# Patient Record
Sex: Male | Born: 1995 | Race: Black or African American | Hispanic: No | Marital: Single | Smoking: Current every day smoker
Health system: Southern US, Community
[De-identification: ages and names within clinical notes are randomized; demographics above are authoritative.]

## PROBLEM LIST (undated history)

## (undated) DIAGNOSIS — K5090 Crohn's disease, unspecified, without complications: Secondary | ICD-10-CM

## (undated) DIAGNOSIS — K769 Liver disease, unspecified: Secondary | ICD-10-CM

## (undated) DIAGNOSIS — K7460 Unspecified cirrhosis of liver: Secondary | ICD-10-CM

## (undated) HISTORY — PX: COLONOSCOPY W/ BIOPSIES: SHX1374

## (undated) HISTORY — PX: ESOPHAGOSCOPY: SUR460

---
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
## 2016-04-19 ENCOUNTER — Emergency Department (HOSPITAL_COMMUNITY)
Admission: EM | Admit: 2016-04-19 | Discharge: 2016-04-20 | Disposition: A | Discharge status: Home / Self Care | Payer: Medicaid Other | Attending: Emergency Medicine | Admitting: Emergency Medicine

## 2016-04-19 ENCOUNTER — Encounter (HOSPITAL_COMMUNITY): Payer: Self-pay | Admitting: Oncology

## 2016-04-19 DIAGNOSIS — R1084 Generalized abdominal pain: Secondary | ICD-10-CM | POA: Insufficient documentation

## 2016-04-19 DIAGNOSIS — R1012 Left upper quadrant pain: Secondary | ICD-10-CM | POA: Diagnosis present

## 2016-04-19 DIAGNOSIS — F17210 Nicotine dependence, cigarettes, uncomplicated: Secondary | ICD-10-CM | POA: Insufficient documentation

## 2016-04-19 DIAGNOSIS — Z79899 Other long term (current) drug therapy: Secondary | ICD-10-CM | POA: Insufficient documentation

## 2016-04-19 LAB — COMPREHENSIVE METABOLIC PANEL
ALK PHOS: 120 U/L (ref 38–126)
ALT: 132 U/L — ABNORMAL HIGH (ref 17–63)
ANION GAP: 6 (ref 5–15)
AST: 84 U/L — ABNORMAL HIGH (ref 15–41)
Albumin: 4.5 g/dL (ref 3.5–5.0)
BUN: 13 mg/dL (ref 6–20)
CALCIUM: 9.3 mg/dL (ref 8.9–10.3)
CHLORIDE: 105 mmol/L (ref 101–111)
CO2: 26 mmol/L (ref 22–32)
Creatinine, Ser: 0.61 mg/dL (ref 0.61–1.24)
GFR calc non Af Amer: >60 mL/min (ref >60)
Glucose, Bld: 96 mg/dL (ref 65–99)
POTASSIUM: 3.8 mmol/L (ref 3.5–5.1)
SODIUM: 137 mmol/L (ref 135–145)
Total Bilirubin: 0.9 mg/dL (ref 0.3–1.2)
Total Protein: 7.7 g/dL (ref 6.5–8.1)

## 2016-04-19 LAB — CBC
HCT: 45.5 % (ref 39.0–52.0)
HEMOGLOBIN: 16.7 g/dL (ref 13.0–17.0)
MCH: 30.5 pg (ref 26.0–34.0)
MCHC: 36.7 g/dL — ABNORMAL HIGH (ref 30.0–36.0)
MCV: 83.2 fL (ref 78.0–100.0)
Platelets: 182 10*3/uL (ref 150–400)
RBC: 5.47 MIL/uL (ref 4.22–5.81)
RDW: 12 % (ref 11.5–15.5)
WBC: 9.7 10*3/uL (ref 4.0–10.5)

## 2016-04-19 LAB — LIPASE, BLOOD: LIPASE: 23 U/L (ref 11–51)

## 2016-04-19 MED ORDER — ONDANSETRON HCL 4 MG/2ML IJ SOLN
4.0000 mg | Freq: Once | INTRAMUSCULAR | Status: AC
  Administered 2016-04-19: 4 mg via INTRAVENOUS
  Filled 2016-04-19: qty 2

## 2016-04-19 MED ORDER — HYDROMORPHONE HCL 1 MG/ML IJ SOLN
1.0000 mg | Freq: Once | INTRAMUSCULAR | Status: AC
  Administered 2016-04-19: 1 mg via INTRAVENOUS
  Filled 2016-04-19: qty 1

## 2016-04-19 MED ORDER — IOPAMIDOL (ISOVUE-300) INJECTION 61%
INTRAVENOUS | Status: DC
Start: 2016-04-19 — End: 2016-04-20
  Filled 2016-04-19: qty 30

## 2016-04-19 MED ORDER — IOPAMIDOL (ISOVUE-300) INJECTION 61%
30.0000 mL | Freq: Once | INTRAVENOUS | Status: AC | PRN
  Administered 2016-04-19: 30 mL via ORAL

## 2016-04-19 MED ORDER — SODIUM CHLORIDE 0.9 % IV BOLUS (SEPSIS)
1000.0000 mL | Freq: Once | INTRAVENOUS | Status: AC
  Administered 2016-04-19: 1000 mL via INTRAVENOUS

## 2016-04-19 NOTE — ED Notes (Signed)
Bed: WA18 Expected date:  Expected time:  Means of arrival:  Comments: Ems  

## 2016-04-19 NOTE — ED Provider Notes (Signed)
[facility]-EMERGENCY DEPT Provider Note   CSN: [id number] Arrival date & time: 04/19/16  2117  By signing my name below, I, Anthony Ponce, attest that this documentation has been prepared under the direction and in the presence of non-physician practitioner, [name], PA-C. Electronically Signed: Modena Ponce, Scribe. 04/19/2016. 9:31 PM.  History   Chief Complaint Chief Complaint  Patient presents with  . Abdominal Pain   The history is provided by the patient. No language interpreter was used.   HPI Comments: Anthony Ponce is a 20 y.o. male who presents to the Emergency Department complaining of constant moderate LUQ abdominal pain that started today. He was diagnosed with Crohn's disease a few years ago and is currently not on medication. He suspects his pain today is due to his Crohn's, however does note girlfriend recently has had a 'stomach bug'.  He took pepto-bismol PTA without relief. He describes the pain as a non-radiating, sharp sensation. He reports associated nausea and gagging. He admits to a hx of stomach ulcers. Seen previously at [facility]. He denies any hx of alcohol use, recent NSAID use, vomiting, diarrhea, blood in stool, or other complaints.     PCP: [name], MD  Past Medical History:  Diagnosis Date  . Cirrhosis (HCC)   . Crohn disease (HCC)   . Liver damage    Pt goes [facility]    There are no active problems to display for this patient.   Past Surgical History:  Procedure Laterality Date  . COLONOSCOPY W/ BIOPSIES    . ESOPHAGOSCOPY         Home Medications    Prior to Admission medications   Medication Sig Start Date End Date Taking? Authorizing Provider  azelastine (ASTELIN) 0.1 % nasal spray Place 2 sprays into both nostrils 2 (two) times daily. Use in each nostril as directed Patient not taking: Reported on 04/19/2016 05/17/15   April Palumbo, MD  ondansetron (ZOFRAN) 4 MG tablet Take 1 tablet (4 mg total) by mouth every 6  (six) hours. Patient not taking: Reported on 04/19/2016 07/23/15   [name], PA-C  predniSONE (DELTASONE) 20 MG tablet Take 3 tablets (60 mg total) by mouth daily. Patient not taking: Reported on 04/19/2016 07/23/15   [name], PA-C  traMADol (ULTRAM) 50 MG tablet Take 1 tablet (50 mg total) by mouth every 6 (six) hours as needed. Patient not taking: Reported on 04/19/2016 07/23/15   [name], PA-C    Family History No family history on file.  Social History Social History  Substance Use Topics  . Smoking status: Current Every Day Smoker    Packs/day: 0.50    Types: Cigarettes  . Smokeless tobacco: Never Used  . Alcohol use Yes     Comment: liquor on 09/25/15 unknown amount     Allergies   Patient has no known allergies.   Review of Systems Review of Systems  Constitutional: Negative for fever.  HENT: Negative for rhinorrhea and sore throat.   Eyes: Negative for redness.  Respiratory: Negative for cough.   Cardiovascular: Negative for chest pain.  Gastrointestinal: Positive for abdominal pain and nausea. Negative for blood in stool, diarrhea and vomiting.  Genitourinary: Negative for dysuria.  Musculoskeletal: Negative for myalgias.  Skin: Negative for rash.  Neurological: Negative for headaches.     Physical Exam Updated Vital Signs BP 131/82 (BP Location: Left Arm)   Pulse 100   Temp 99.1 F (37.3 C) (Oral)   Resp 20   Ht  5\' 10"  (1.778 m)   Wt 168 lb (76.2 kg)   SpO2 98%   BMI 24.11 kg/m   Physical Exam  Constitutional: He appears well-developed and well-nourished. No distress.  HENT:  Head: Normocephalic and atraumatic.  Eyes: Conjunctivae are normal. Right eye exhibits no discharge. Left eye exhibits no discharge.  Neck: Normal range of motion. Neck supple.  Cardiovascular: Normal rate, regular rhythm and normal heart sounds.   Pulmonary/Chest: Effort normal and breath sounds normal.  Abdominal: Soft. He exhibits no distension and no  mass. There is tenderness. There is no rebound and no guarding.  LUQ and epigastric abd pain.   Musculoskeletal: Normal range of motion.  Neurological: He is alert.  Skin: Skin is warm and dry.  Psychiatric: He has a normal mood and affect.  Nursing note and vitals reviewed.    ED Treatments / Results  DIAGNOSTIC STUDIES: Oxygen Saturation is 98% on RA, normal by my interpretation.    COORDINATION OF CARE: 9:57 PM- Pt advised of plan for treatment and pt agrees.  Labs (all labs ordered are listed, but only abnormal results are displayed) Labs Reviewed  COMPREHENSIVE METABOLIC PANEL - Abnormal; Notable for the following:       Result Value   AST 84 (*)    ALT 132 (*)    All other components within normal limits  CBC - Abnormal; Notable for the following:    MCHC 36.7 (*)    All other components within normal limits  URINALYSIS, ROUTINE W REFLEX MICROSCOPIC - Abnormal; Notable for the following:    APPearance HAZY (*)    All other components within normal limits  LIPASE, BLOOD    Radiology Ct Abdomen Pelvis W Contrast  Result Date: 04/20/2016 CLINICAL DATA:  Left upper quadrant abdominal pain for 1 day. Nausea. EXAM: CT ABDOMEN AND PELVIS WITH CONTRAST TECHNIQUE: Multidetector CT imaging of the abdomen and pelvis was performed using the standard protocol following bolus administration of intravenous contrast. CONTRAST:  100 mL Isovue-300 intravenous COMPARISON:  09/14/2014 MRI, 11/20/2012 CT FINDINGS: Lower chest: No acute abnormality. Hepatobiliary: No focal liver abnormality is seen. No gallstones, gallbladder wall thickening, or biliary dilatation. Pancreas: Unremarkable. No pancreatic ductal dilatation or surrounding inflammatory changes. Spleen: Normal in size without focal abnormality. Adrenals/Urinary Tract: Adrenal glands are unremarkable. Kidneys are normal, without renal calculi, focal lesion, or hydronephrosis. Bladder is unremarkable. Stomach/Bowel: Stomach is within  normal limits. Colon appears normal. No evidence of bowel wall thickening, distention, or inflammatory changes. Vascular/Lymphatic: No significant vascular findings are present. No enlarged abdominal or pelvic lymph nodes. Reproductive: Unremarkable Other: No acute inflammatory changes.  No ascites. Musculoskeletal: No significant skeletal lesion. IMPRESSION: No significant abnormality.  Mild motion degradation of the images. Electronically Signed   By: Ellery Plunk M.D.   On: 04/20/2016 01:16    Procedures Procedures (including critical care time)  Medications Ordered in ED Medications  iopamidol (ISOVUE-300) 61 % injection (not administered)  HYDROmorphone (DILAUDID) injection 1 mg (1 mg Intravenous Given 04/19/16 2232)  ondansetron (ZOFRAN) injection 4 mg (4 mg Intravenous Given 04/19/16 2232)  sodium chloride 0.9 % bolus 1,000 mL (0 mLs Intravenous Stopped 04/19/16 2329)  iopamidol (ISOVUE-300) 61 % injection 30 mL (30 mLs Oral Contrast Given 04/19/16 2256)  iopamidol (ISOVUE-300) 61 % injection 100 mL (100 mLs Intravenous Contrast Given 04/20/16 0052)  sodium chloride 0.9 % bolus 1,000 mL (0 mLs Intravenous Stopped 04/20/16 0243)  pantoprazole (PROTONIX) injection 40 mg (40 mg Intravenous Given 04/20/16 0121)  ondansetron [facility](ZOFRAN) injection 4 mg (4 mg Intravenous Given 04/20/16 0121)  ondansetron (ZOFRAN) injection 4 mg (4 mg Intravenous Given 04/20/16 0249)  HYDROmorphone (DILAUDID) injection 1 mg (1 mg Intravenous Given 04/20/16 0548)  sodium chloride 0.9 % bolus 1,000 mL (0 mLs Intravenous Stopped 04/20/16 0704)     Initial Impression / Assessment and Plan / ED Course  I have reviewed the triage vital signs and the nursing notes.  Pertinent labs & imaging results that were available during my care of the patient were reviewed by me and considered in my medical decision making (see chart for details).  Clinical Course    Patient seen and examined. Work-up initiated. Medications ordered.     Vital signs reviewed and are as follows: BP 117/75   Pulse 92   Temp 99.1 F (37.3 C) (Oral)   Resp 18   Ht 5\' 10"  (1.778 m)   Wt 76.2 kg   SpO2 96%   BMI 24.11 kg/m   Patient stable overnight. CT negative. Symptoms controlled with pain medication, fluids, antiemetics. Patient tolerating fluids and crackers in the room. Stable for discharge to home.  The patient was urged to return to the Emergency Department immediately with worsening of current symptoms, worsening abdominal pain, persistent vomiting, blood noted in stools, fever, or any other concerns. The patient verbalized understanding.   Patient counseled on use of narcotic pain medications. Counseled not to combine these medications with others containing tylenol. Urged not to drink alcohol, drive, or perform any other activities that requires focus while taking these medications. The patient verbalizes understanding and agrees with the plan.   Final Clinical Impressions(s) / ED Diagnoses   Final diagnoses:  Generalized abdominal pain   Patient with generalized abdominal pain, nausea, history of Crohn's disease. Pain was severe at arrival. CT ordered and is negative for acute problem. Symptom control overnight with IV fluids, pain medications, antiemetics. Patient feeling better and tolerating orals at time of discharge. Return instructions as above. Encouraged PCP follow-up.  Vitals are stable, no fever. Labs reassuring. Imaging neg. No signs of dehydration, patient is tolerating PO's. Lungs are clear and no signs suggestive of PNA. Low concern for appendicitis, cholecystitis, pancreatitis, ruptured viscus, UTI, kidney stone, aortic dissection, aortic aneurysm or other emergent abdominal etiology. Supportive therapy indicated with return if symptoms worsen.    New Prescriptions Discharge Medication List as of 04/20/2016  6:39 AM    START taking these medications   Details  HYDROcodone-acetaminophen (NORCO/VICODIN) 5-325 MG  tablet Take 1-2 tablets by mouth every 4 (four) hours as needed., Starting Mon 04/20/2016, Print    ondansetron (ZOFRAN ODT) 4 MG disintegrating tablet Take 1 tablet (4 mg total) by mouth every 8 (eight) hours as needed for nausea or vomiting., Starting Mon 04/20/2016, Print       I personally performed the services described in this documentation, which was scribed in my presence. The recorded information has been reviewed and is accurate.     Renne CriglerJoshua Noya Santarelli, PA-C 04/20/16 [id number]    Charlynne Panderavid Hsienta Yao, MD 04/20/16 1154

## 2016-04-19 NOTE — ED Triage Notes (Signed)
Pt bib [facility] from home d/t abdominal pain, nausea and vomiting today.  Pt given 4 mg IV zofran en route.  Pt has hx of crohn's and is non compliant w/ medication.  C/o  LUQ/LLQ pain.

## 2016-04-20 ENCOUNTER — Emergency Department (HOSPITAL_COMMUNITY): Payer: Medicaid Other

## 2016-04-20 ENCOUNTER — Encounter (HOSPITAL_COMMUNITY): Payer: Self-pay

## 2016-04-20 LAB — URINALYSIS, ROUTINE W REFLEX MICROSCOPIC
Bilirubin Urine: NEGATIVE
Glucose, UA: NEGATIVE mg/dL
HGB URINE DIPSTICK: NEGATIVE
Ketones, ur: NEGATIVE mg/dL
Leukocytes, UA: NEGATIVE
NITRITE: NEGATIVE
PROTEIN: NEGATIVE mg/dL
Specific Gravity, Urine: 1.016 (ref 1.005–1.030)
pH: 7 (ref 5.0–8.0)

## 2016-04-20 MED ORDER — IOPAMIDOL (ISOVUE-300) INJECTION 61%
INTRAVENOUS | Status: AC
  Administered 2016-04-20: 100 mL via INTRAVENOUS
  Filled 2016-04-20: qty 100

## 2016-04-20 MED ORDER — ONDANSETRON HCL 4 MG/2ML IJ SOLN
4.0000 mg | Freq: Once | INTRAMUSCULAR | Status: AC
  Administered 2016-04-20: 4 mg via INTRAVENOUS
  Filled 2016-04-20: qty 2

## 2016-04-20 MED ORDER — IOPAMIDOL (ISOVUE-300) INJECTION 61%
100.0000 mL | Freq: Once | INTRAVENOUS | Status: AC | PRN
  Administered 2016-04-20: 100 mL via INTRAVENOUS

## 2016-04-20 MED ORDER — ONDANSETRON 4 MG PO TBDP: 4.0000 mg | ORAL_TABLET | Freq: Three times a day (TID) | ORAL | 0 refills | Status: DC | PRN

## 2016-04-20 MED ORDER — HYDROCODONE-ACETAMINOPHEN 5-325 MG PO TABS: 1.0000 | ORAL_TABLET | ORAL | 0 refills | Status: DC | PRN

## 2016-04-20 MED ORDER — PANTOPRAZOLE SODIUM 40 MG IV SOLR
40.0000 mg | Freq: Once | INTRAVENOUS | Status: AC
  Administered 2016-04-20: 40 mg via INTRAVENOUS
  Filled 2016-04-20: qty 40

## 2016-04-20 MED ORDER — HYDROMORPHONE HCL 1 MG/ML IJ SOLN
1.0000 mg | Freq: Once | INTRAMUSCULAR | Status: AC
  Administered 2016-04-20: 1 mg via INTRAVENOUS
  Filled 2016-04-20: qty 1

## 2016-04-20 MED ORDER — SODIUM CHLORIDE 0.9 % IV BOLUS (SEPSIS)
1000.0000 mL | Freq: Once | INTRAVENOUS | Status: AC
  Administered 2016-04-20: 1000 mL via INTRAVENOUS

## 2016-04-20 NOTE — ED Notes (Signed)
Patient transported to CT 

## 2016-04-20 NOTE — Discharge Instructions (Signed)
Please read and follow all provided instructions.  Your diagnoses today include:  1. Generalized abdominal pain     Tests performed today include:  Blood counts and electrolytes  Blood tests to check liver and kidney function  Blood tests to check pancreas function  Urine test to look for infection  CT scan of your abdomen - does not show any serious problems  Vital signs. See below for your results today.   Medications prescribed:   Vicodin (hydrocodone/acetaminophen) - narcotic pain medication  DO NOT drive or perform any activities that require you to be awake and alert because this medicine can make you drowsy. BE VERY CAREFUL not to take multiple medicines containing Tylenol (also called acetaminophen). Doing so can lead to an overdose which can damage your liver and cause liver failure and possibly death.   Zofran (ondansetron) - for nausea and vomiting  Take any prescribed medications only as directed.  Home care instructions:   Follow any educational materials contained in this packet.  Follow-up instructions: Please follow-up with your primary care provider in the next 3 days for further evaluation of your symptoms.    Return instructions:  SEEK IMMEDIATE MEDICAL ATTENTION IF:  The pain does not go away or becomes severe   A temperature above 101F develops   Repeated vomiting occurs (multiple episodes)   The pain becomes localized to portions of the abdomen. The right side could possibly be appendicitis. In an adult, the left lower portion of the abdomen could be colitis or diverticulitis.   Blood is being passed in stools or vomit (bright red or black tarry stools)   You develop chest pain, difficulty breathing, dizziness or fainting, or become confused, poorly responsive, or inconsolable (young children)  If you have any other emergent concerns regarding your health  Additional Information: Abdominal (belly) pain can be caused by many things. Your  caregiver performed an examination and possibly ordered blood/urine tests and imaging (CT scan, x-rays, ultrasound). Many cases can be observed and treated at home after initial evaluation in the emergency department. Even though you are being discharged home, abdominal pain can be unpredictable. Therefore, you need a repeated exam if your pain does not resolve, returns, or worsens. Most patients with abdominal pain don't have to be admitted to the hospital or have surgery, but serious problems like appendicitis and gallbladder attacks can start out as nonspecific pain. Many abdominal conditions cannot be diagnosed in one visit, so follow-up evaluations are very important.  Your vital signs today were: BP 118/71    Pulse 86    Temp 99.1 F (37.3 C) (Oral)    Resp 18    Ht 5\' 10"  (1.778 m)    Wt 76.2 kg    SpO2 94%    BMI 24.11 kg/m  If your blood pressure (bp) was elevated above 135/85 this visit, please have this repeated by your doctor within one month. --------------

## 2016-10-28 ENCOUNTER — Encounter (HOSPITAL_COMMUNITY): Payer: Self-pay | Admitting: *Deleted

## 2016-10-28 ENCOUNTER — Ambulatory Visit (HOSPITAL_COMMUNITY)
Admission: EM | Admit: 2016-10-28 | Discharge: 2016-10-28 | Disposition: A | Discharge status: Home / Self Care | Payer: Medicaid Other | Attending: Internal Medicine | Admitting: Internal Medicine

## 2016-10-28 DIAGNOSIS — S39012A Strain of muscle, fascia and tendon of lower back, initial encounter: Secondary | ICD-10-CM | POA: Diagnosis not present

## 2016-10-28 DIAGNOSIS — M62830 Muscle spasm of back: Secondary | ICD-10-CM

## 2016-10-28 MED ORDER — MELOXICAM 15 MG PO TABS: 15.0000 mg | ORAL_TABLET | Freq: Every day | ORAL | 0 refills | Status: DC

## 2016-10-28 MED ORDER — IBUPROFEN 800 MG PO TABS
800.0000 mg | ORAL_TABLET | Freq: Once | ORAL | Status: AC
  Administered 2016-10-28: 800 mg via ORAL

## 2016-10-28 MED ORDER — IBUPROFEN 800 MG PO TABS
ORAL_TABLET | ORAL | Status: AC
  Filled 2016-10-28: qty 1

## 2016-10-28 MED ORDER — METHOCARBAMOL 500 MG PO TABS: 500.0000 mg | ORAL_TABLET | Freq: Two times a day (BID) | ORAL | 0 refills | Status: DC

## 2016-10-28 NOTE — ED Triage Notes (Addendum)
Pt  Reports  Back  Pain    And         Had   Some   Muscle    Spasm           Had   Some   Numbness          Better   Today    Works       In  The   [employer]  [employer]ot     Outside    Recently        Never   [employer]

## 2016-10-28 NOTE — Discharge Instructions (Signed)
°  Meloxicam (Mobic) is an antiinflammatory to help with pain and inflammation.  Do not take ibuprofen, Advil, Aleve, or any other medications that contain NSAIDs while taking meloxicam as this may cause stomach upset or even ulcers if taken in large amounts for an extended period of time.  ° °Robaxin (methocarbamol) is a muscle relaxer and may cause drowsiness. Do not drink alcohol, drive, or operate heavy machinery while taking. ° °

## 2016-10-28 NOTE — ED Provider Notes (Signed)
CSN: [id number]     Arrival date & time 10/28/16  1707 History   First MD Initiated Contact with Patient 10/28/16 1741     Chief Complaint  Patient presents with  . Back Pain   (Consider location/radiation/quality/duration/timing/severity/associated sxs/prior Treatment) HPI  Anthony Ponce is a 21 y.o. male presenting to UC with c/o Left side mid back pain that started about 2 weeks ago. Pt does heavy lifting at work and thinks it started when lifting some cinder blocks.  Pain is sharp and burning, worse with certain movements.  Pain is 5/10. No radiation of pain or numbness. No hx of back surgeries. He has tried tylenol w/o relief.     Past Medical History:  Diagnosis Date  . Cirrhosis (HCC)   . Crohn disease (HCC)   . Liver damage    Pt goes to [facility]   Past Surgical History:  Procedure Laterality Date  . COLONOSCOPY W/ BIOPSIES    . ESOPHAGOSCOPY     No family history on file. Social History  Substance Use Topics  . Smoking status: Current Every Day Smoker    Packs/day: 0.50    Types: Cigarettes  . Smokeless tobacco: Never Used  . Alcohol use Yes     Comment: liquor on 09/25/15 unknown amount    Review of Systems  Constitutional: Negative for chills and fever.  Cardiovascular: Negative for chest pain and palpitations.  Gastrointestinal: Negative for abdominal pain, diarrhea, nausea and vomiting.  Genitourinary: Negative for dysuria, frequency and hematuria.  Musculoskeletal: Positive for back pain and myalgias. Negative for arthralgias.  Skin: Negative for rash.  Neurological: Negative for weakness and numbness.    Allergies  Patient has no known allergies.  Home Medications   Prior to Admission medications   Medication Sig Start Date End Date Taking? Authorizing Provider  HYDROcodone-acetaminophen (NORCO/VICODIN) 5-325 MG tablet Take 1-2 tablets by mouth every 4 (four) hours as needed. 04/20/16   Renne Crigler, PA-C  meloxicam (MOBIC) 15 MG tablet  Take 1 tablet (15 mg total) by mouth daily. For 5 days, then daily as needed for pain. 10/28/16   Lurene Shadow, PA-C  methocarbamol (ROBAXIN) 500 MG tablet Take 1 tablet (500 mg total) by mouth 2 (two) times daily. 10/28/16   Lurene Shadow, PA-C  ondansetron (ZOFRAN ODT) 4 MG disintegrating tablet Take 1 tablet (4 mg total) by mouth every 8 (eight) hours as needed for nausea or vomiting. 04/20/16   Renne Crigler, PA-C   Meds Ordered and Administered this Visit   Medications  ibuprofen (ADVIL,MOTRIN) tablet 800 mg (800 mg Oral Given 10/28/16 1801)    BP 117/70 (BP Location: Right Arm)   Pulse 78   Temp 98.6 F (37 C) (Oral)   Resp 18   SpO2 100%  No data found.   Physical Exam  Constitutional: He is oriented to person, place, and time. He appears well-developed and well-nourished. No distress.  HENT:  Head: Normocephalic and atraumatic.  Eyes: EOM are normal.  Neck: Normal range of motion.  Cardiovascular: Normal rate.   Pulmonary/Chest: Effort normal.  Musculoskeletal: Normal range of motion. He exhibits tenderness. He exhibits no edema.  No midline spinal tenderness. Tenderness to Left mid thoracic paraspinal muscles.  Full ROM upper and lower extremities bilaterally with 5/5 strength.  Neurological: He is alert and oriented to person, place, and time.  Skin: Skin is warm and dry. No rash noted. He is not diaphoretic. No erythema.  Back: skin in tact. No  ecchymosis or erythema.  Psychiatric: He has a normal mood and affect. His behavior is normal.  Nursing note and vitals reviewed.   Urgent Care Course     Procedures (including critical care time)  Labs Review Labs Reviewed - No data to display  Imaging Review No results found.    MDM   1. Back strain, initial encounter   2. Muscle spasm of back    Hx and exam c/w mid back strain. No indication for imaging at this time.   Will treat conservatively at this time. Rx: Robaxin and Meloxicam  Home care  instructions provided with back exercises F/u with PCP, Sports Medicine or Orthopedics in 1 week if not improving Work note provided to be off today, tomorrow, followed by 3 days of light duty.     Lurene Shadow, [place] 10/28/16 [phone]

## 2017-12-31 IMAGING — CR DG CHEST 2V
2 series · 2 of 2 positions shown · non-contrast
Comparison: None.

CLINICAL DATA: Cough and chest congestion for 2 weeks, worse this
morning. Smoker.

EXAM:
CHEST  2 VIEW

[w chest pa]
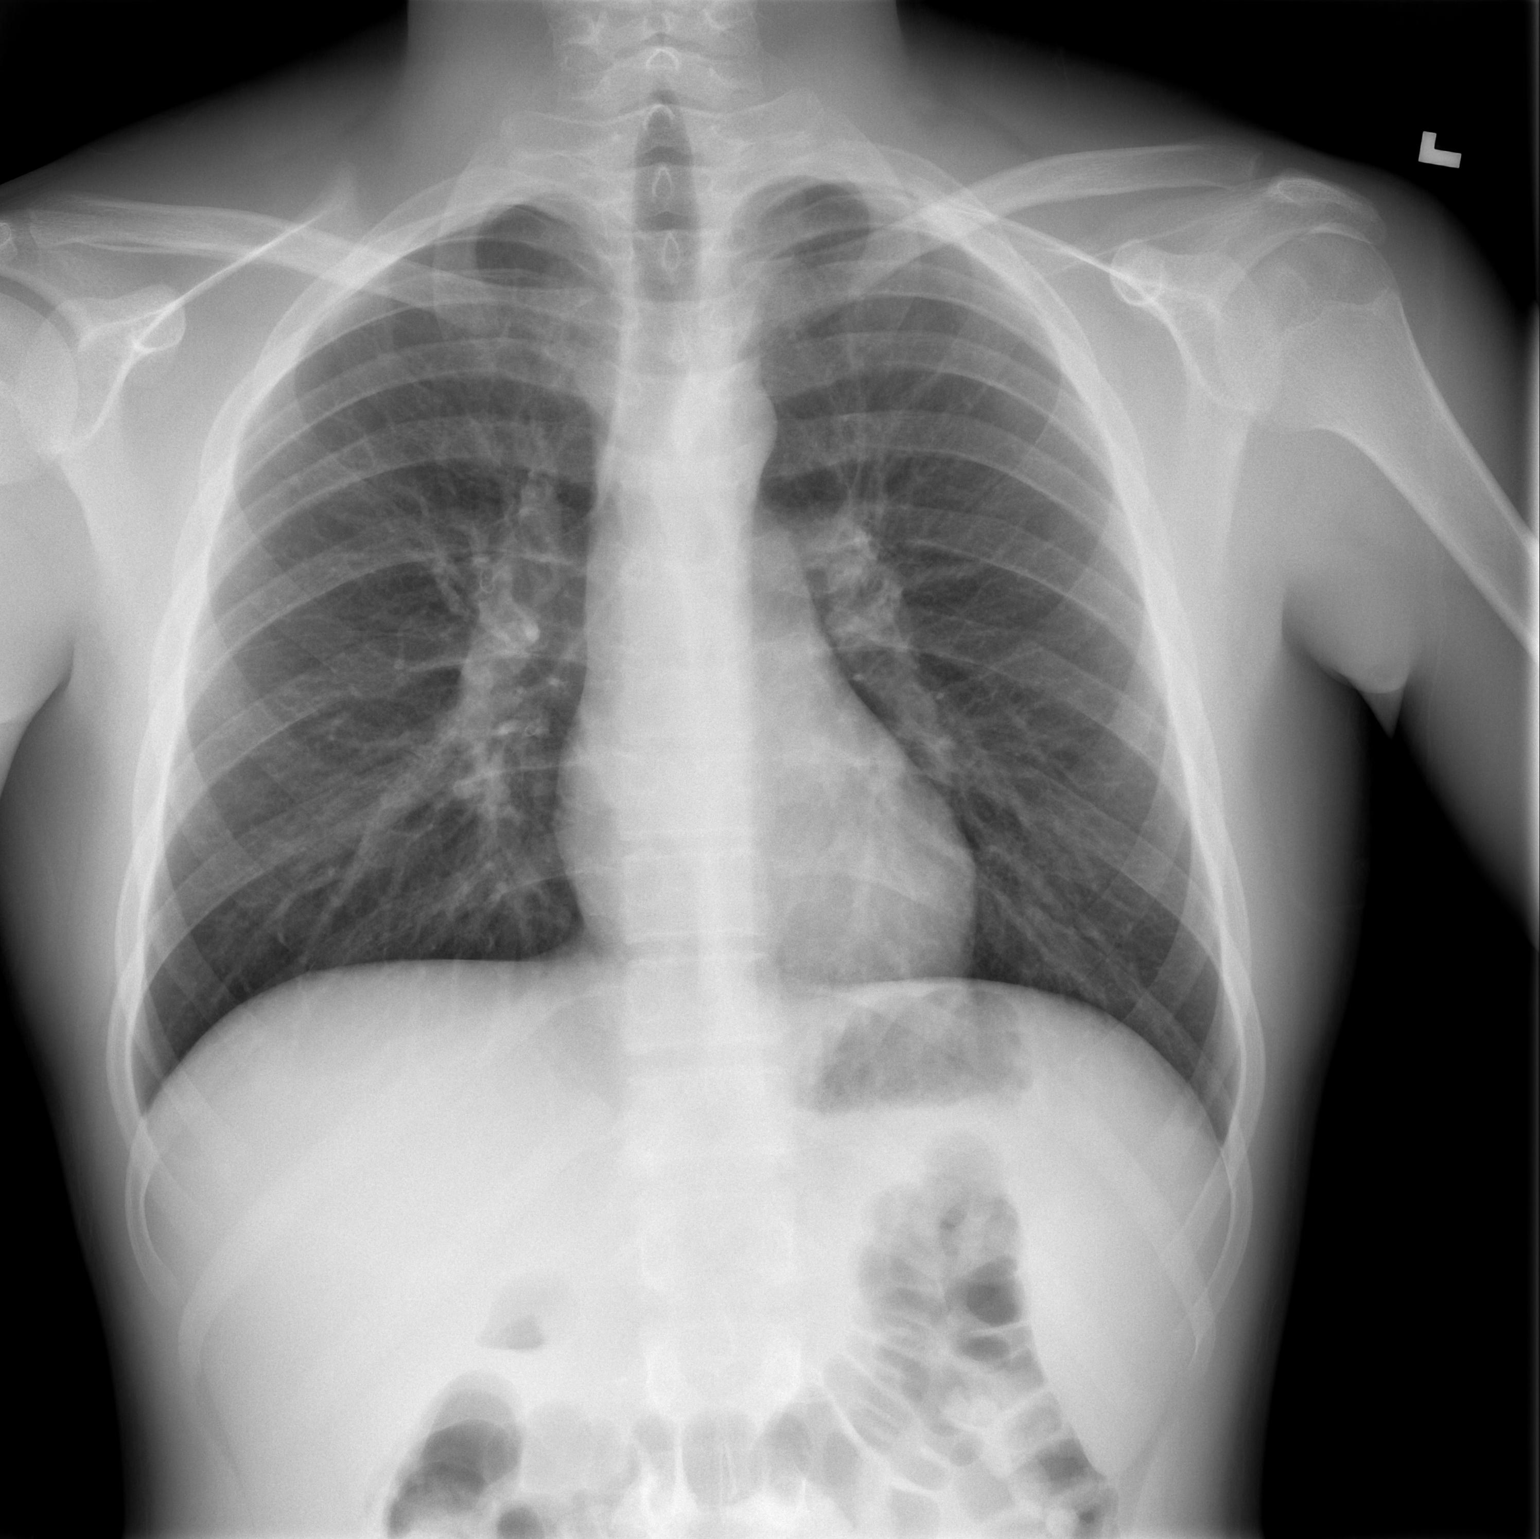

[w chest lat]
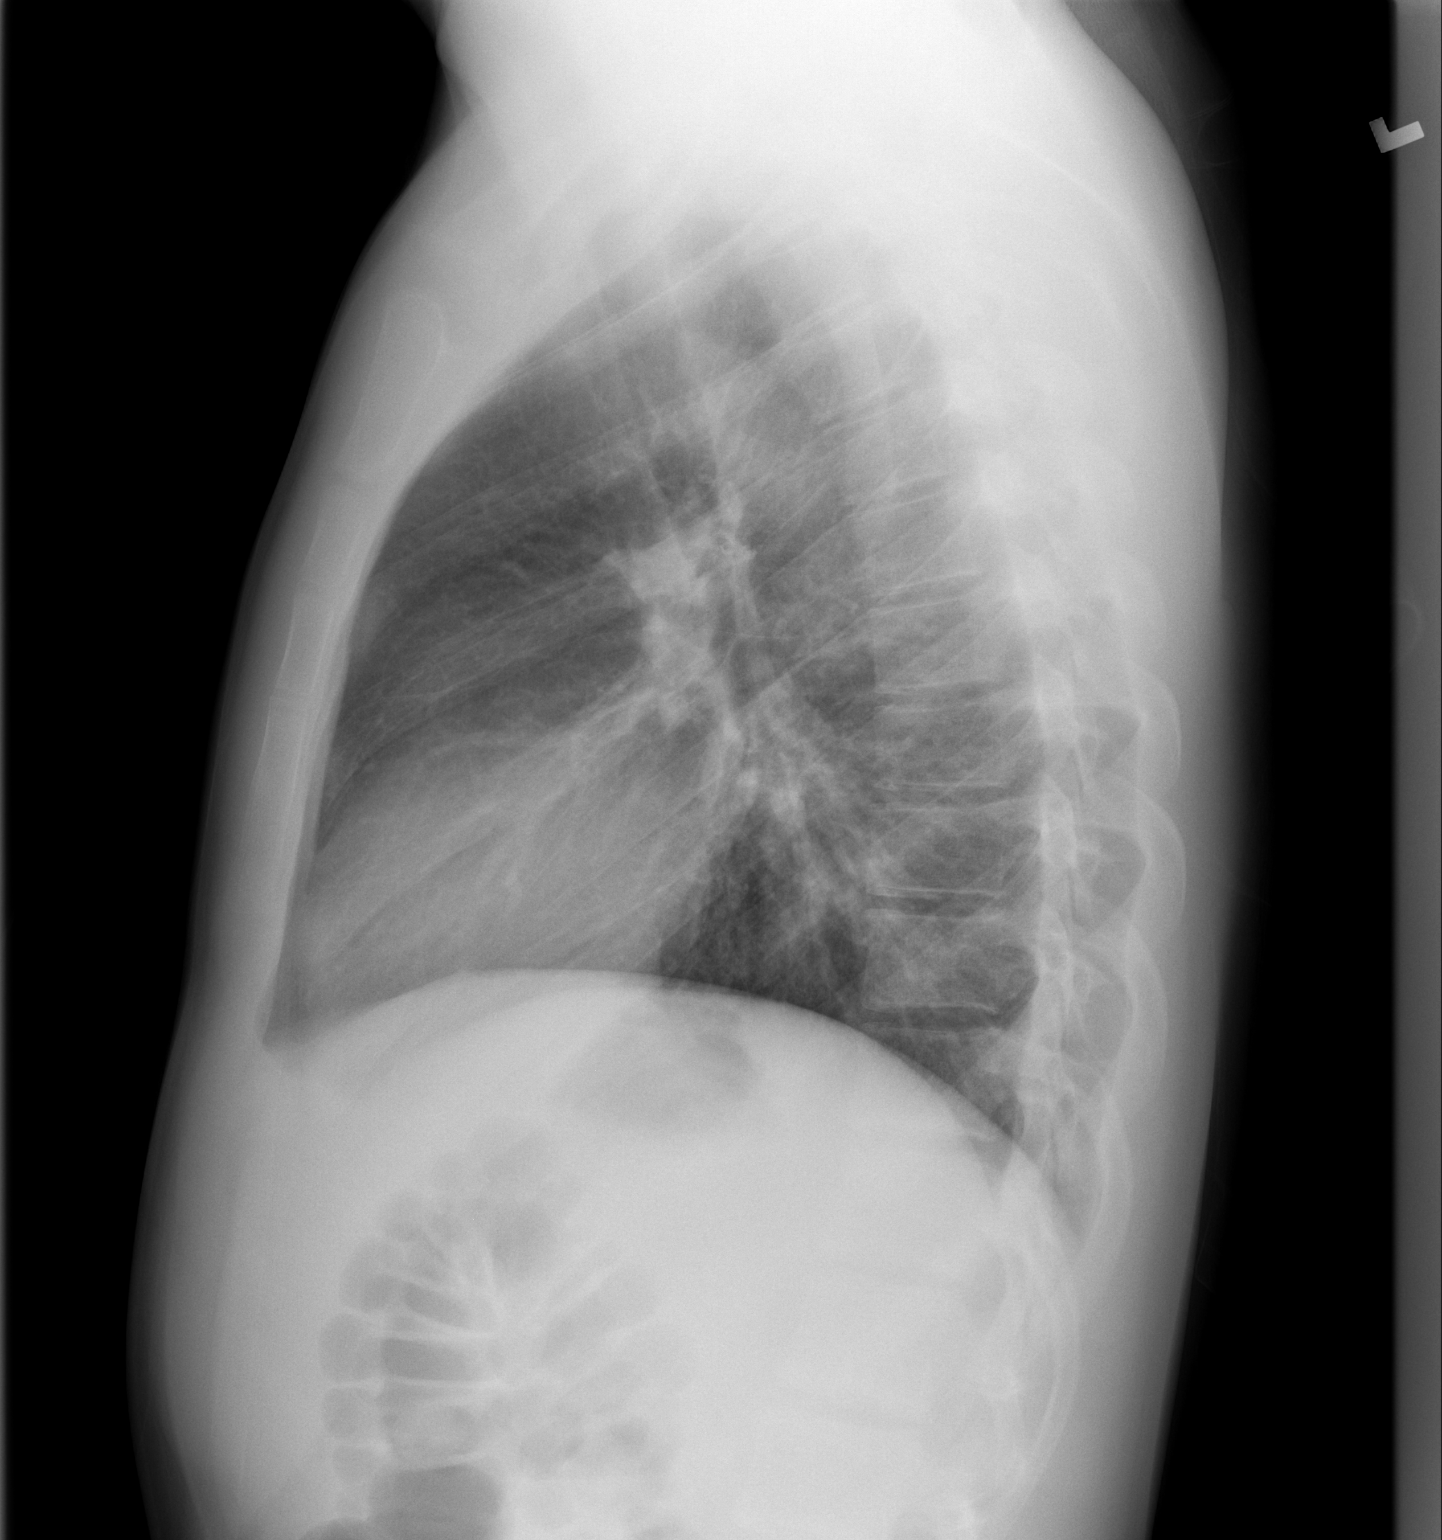

[2 of 2 positions shown; findings below may reference images not displayed]

FINDINGS: The heart size and mediastinal contours are within normal limits.
Both lungs are clear. The visualized skeletal structures are
unremarkable.
IMPRESSION: No active cardiopulmonary disease.

## 2020-04-18 ENCOUNTER — Emergency Department (HOSPITAL_COMMUNITY): Payer: Self-pay

## 2020-04-18 ENCOUNTER — Inpatient Hospital Stay (HOSPITAL_COMMUNITY)
Admission: EM | Admit: 2020-04-18 | Discharge: 2020-04-20 | DRG: 193 | Disposition: A | Discharge status: Home / Self Care | Payer: Self-pay | Attending: Internal Medicine | Admitting: Internal Medicine

## 2020-04-18 ENCOUNTER — Encounter (HOSPITAL_COMMUNITY): Payer: Self-pay | Admitting: Emergency Medicine

## 2020-04-18 ENCOUNTER — Other Ambulatory Visit: Payer: Self-pay

## 2020-04-18 DIAGNOSIS — R042 Hemoptysis: Secondary | ICD-10-CM | POA: Diagnosis present

## 2020-04-18 DIAGNOSIS — Z9114 Patient's other noncompliance with medication regimen: Secondary | ICD-10-CM

## 2020-04-18 DIAGNOSIS — K769 Liver disease, unspecified: Secondary | ICD-10-CM | POA: Diagnosis present

## 2020-04-18 DIAGNOSIS — Z20822 Contact with and (suspected) exposure to covid-19: Secondary | ICD-10-CM | POA: Diagnosis present

## 2020-04-18 DIAGNOSIS — D869 Sarcoidosis, unspecified: Secondary | ICD-10-CM | POA: Diagnosis present

## 2020-04-18 DIAGNOSIS — R195 Other fecal abnormalities: Secondary | ICD-10-CM

## 2020-04-18 DIAGNOSIS — Z720 Tobacco use: Secondary | ICD-10-CM

## 2020-04-18 DIAGNOSIS — J189 Pneumonia, unspecified organism: Principal | ICD-10-CM | POA: Diagnosis present

## 2020-04-18 DIAGNOSIS — K219 Gastro-esophageal reflux disease without esophagitis: Secondary | ICD-10-CM | POA: Diagnosis present

## 2020-04-18 DIAGNOSIS — Z8249 Family history of ischemic heart disease and other diseases of the circulatory system: Secondary | ICD-10-CM

## 2020-04-18 DIAGNOSIS — R55 Syncope and collapse: Secondary | ICD-10-CM | POA: Diagnosis present

## 2020-04-18 DIAGNOSIS — K7469 Other cirrhosis of liver: Secondary | ICD-10-CM | POA: Diagnosis present

## 2020-04-18 DIAGNOSIS — R0489 Hemorrhage from other sites in respiratory passages: Secondary | ICD-10-CM

## 2020-04-18 DIAGNOSIS — R918 Other nonspecific abnormal finding of lung field: Secondary | ICD-10-CM | POA: Diagnosis present

## 2020-04-18 DIAGNOSIS — F17210 Nicotine dependence, cigarettes, uncomplicated: Secondary | ICD-10-CM | POA: Diagnosis present

## 2020-04-18 DIAGNOSIS — K50918 Crohn's disease, unspecified, with other complication: Secondary | ICD-10-CM | POA: Diagnosis present

## 2020-04-18 DIAGNOSIS — R682 Dry mouth, unspecified: Secondary | ICD-10-CM | POA: Diagnosis present

## 2020-04-18 DIAGNOSIS — K920 Hematemesis: Secondary | ICD-10-CM | POA: Diagnosis present

## 2020-04-18 DIAGNOSIS — R609 Edema, unspecified: Secondary | ICD-10-CM | POA: Diagnosis present

## 2020-04-18 DIAGNOSIS — J9601 Acute respiratory failure with hypoxia: Secondary | ICD-10-CM | POA: Diagnosis present

## 2020-04-18 DIAGNOSIS — F1210 Cannabis abuse, uncomplicated: Secondary | ICD-10-CM | POA: Diagnosis present

## 2020-04-18 DIAGNOSIS — K50919 Crohn's disease, unspecified, with unspecified complications: Secondary | ICD-10-CM

## 2020-04-18 LAB — URINALYSIS, ROUTINE W REFLEX MICROSCOPIC
Bilirubin Urine: NEGATIVE
Glucose, UA: NEGATIVE mg/dL
Hgb urine dipstick: NEGATIVE
Ketones, ur: NEGATIVE mg/dL
Leukocytes,Ua: NEGATIVE
Nitrite: NEGATIVE
Protein, ur: NEGATIVE mg/dL
Specific Gravity, Urine: >1.046 — ABNORMAL HIGH (ref 1.005–1.030)
pH: 6 (ref 5.0–8.0)

## 2020-04-18 LAB — RAPID URINE DRUG SCREEN, HOSP PERFORMED
Amphetamines: NOT DETECTED
Barbiturates: NOT DETECTED
Benzodiazepines: NOT DETECTED
Cocaine: NOT DETECTED
Opiates: NOT DETECTED
Tetrahydrocannabinol: POSITIVE — AB

## 2020-04-18 LAB — CBC WITH DIFFERENTIAL/PLATELET
Abs Immature Granulocytes: 0.1 10*3/uL — ABNORMAL HIGH (ref 0.00–0.07)
Basophils Absolute: 0.1 10*3/uL (ref 0.0–0.1)
Basophils Relative: 0 %
Eosinophils Absolute: 0 10*3/uL (ref 0.0–0.5)
Eosinophils Relative: 0 %
HCT: 47.2 % (ref 39.0–52.0)
Hemoglobin: 16.7 g/dL (ref 13.0–17.0)
Immature Granulocytes: 1 %
Lymphocytes Relative: 7 %
Lymphs Abs: 1.2 10*3/uL (ref 0.7–4.0)
MCH: 30.9 pg (ref 26.0–34.0)
MCHC: 35.4 g/dL (ref 30.0–36.0)
MCV: 87.4 fL (ref 80.0–100.0)
Monocytes Absolute: 1.4 10*3/uL — ABNORMAL HIGH (ref 0.1–1.0)
Monocytes Relative: 8 %
Neutro Abs: 15.3 10*3/uL — ABNORMAL HIGH (ref 1.7–7.7)
Neutrophils Relative %: 84 %
Platelets: 202 10*3/uL (ref 150–400)
RBC: 5.4 MIL/uL (ref 4.22–5.81)
RDW: 12.1 % (ref 11.5–15.5)
WBC: 18.1 10*3/uL — ABNORMAL HIGH (ref 4.0–10.5)
nRBC: 0 % (ref 0.0–0.2)

## 2020-04-18 LAB — COMPREHENSIVE METABOLIC PANEL
ALT: 217 U/L — ABNORMAL HIGH (ref 0–44)
AST: 173 U/L — ABNORMAL HIGH (ref 15–41)
Albumin: 4.2 g/dL (ref 3.5–5.0)
Alkaline Phosphatase: 111 U/L (ref 38–126)
Anion gap: 8 (ref 5–15)
BUN: 13 mg/dL (ref 6–20)
CO2: 25 mmol/L (ref 22–32)
Calcium: 9.1 mg/dL (ref 8.9–10.3)
Chloride: 104 mmol/L (ref 98–111)
Creatinine, Ser: 0.98 mg/dL (ref 0.61–1.24)
GFR, Estimated: >60 mL/min (ref >60)
Glucose, Bld: 121 mg/dL — ABNORMAL HIGH (ref 70–99)
Potassium: 3.6 mmol/L (ref 3.5–5.1)
Sodium: 137 mmol/L (ref 135–145)
Total Bilirubin: 0.6 mg/dL (ref 0.3–1.2)
Total Protein: 7.4 g/dL (ref 6.5–8.1)

## 2020-04-18 LAB — TYPE AND SCREEN
ABO/RH(D): B POS
Antibody Screen: NEGATIVE

## 2020-04-18 LAB — STREP PNEUMONIAE URINARY ANTIGEN: Strep Pneumo Urinary Antigen: NEGATIVE

## 2020-04-18 LAB — SEDIMENTATION RATE: Sed Rate: 2 mm/hr (ref 0–16)

## 2020-04-18 LAB — TSH: TSH: 0.257 u[IU]/mL — ABNORMAL LOW (ref 0.350–4.500)

## 2020-04-18 LAB — ETHANOL: Alcohol, Ethyl (B): <10 mg/dL (ref <10)

## 2020-04-18 LAB — POC OCCULT BLOOD, ED: Fecal Occult Bld: POSITIVE — AB

## 2020-04-18 LAB — RESP PANEL BY RT-PCR (FLU A&B, COVID) ARPGX2
Influenza A by PCR: NEGATIVE
Influenza B by PCR: NEGATIVE
SARS Coronavirus 2 by RT PCR: NEGATIVE

## 2020-04-18 LAB — PROTIME-INR
INR: 1 (ref 0.8–1.2)
Prothrombin Time: 13.2 seconds (ref 11.4–15.2)

## 2020-04-18 LAB — PHOSPHORUS: Phosphorus: 4.1 mg/dL (ref 2.5–4.6)

## 2020-04-18 LAB — MAGNESIUM: Magnesium: 1.9 mg/dL (ref 1.7–2.4)

## 2020-04-18 LAB — PROCALCITONIN: Procalcitonin: <0.1 ng/mL

## 2020-04-18 LAB — HIV ANTIBODY (ROUTINE TESTING W REFLEX): HIV Screen 4th Generation wRfx: NONREACTIVE

## 2020-04-18 LAB — POC SARS CORONAVIRUS 2 AG -  ED: SARS Coronavirus 2 Ag: NEGATIVE

## 2020-04-18 MED ORDER — NICOTINE 14 MG/24HR TD PT24
14.0000 mg | MEDICATED_PATCH | Freq: Every day | TRANSDERMAL | Status: DC
  Administered 2020-04-19 – 2020-04-20 (×2): 14 mg via TRANSDERMAL
  Filled 2020-04-18 (×3): qty 1

## 2020-04-18 MED ORDER — GUAIFENESIN-DM 100-10 MG/5ML PO SYRP: 5.0000 mL | ORAL_SOLUTION | ORAL | Status: DC | PRN

## 2020-04-18 MED ORDER — SODIUM CHLORIDE 0.9 % IV SOLN
2.0000 g | INTRAVENOUS | Status: DC
  Administered 2020-04-18 – 2020-04-20 (×3): 2 g via INTRAVENOUS
  Filled 2020-04-18 (×3): qty 20

## 2020-04-18 MED ORDER — SODIUM CHLORIDE 0.9 % IV SOLN
500.0000 mg | Freq: Once | INTRAVENOUS | Status: AC
  Administered 2020-04-18: 07:00:00 500 mg via INTRAVENOUS
  Filled 2020-04-18: qty 500

## 2020-04-18 MED ORDER — PANTOPRAZOLE SODIUM 40 MG PO TBEC
40.0000 mg | DELAYED_RELEASE_TABLET | Freq: Two times a day (BID) | ORAL | Status: DC
  Administered 2020-04-18 – 2020-04-19 (×3): 40 mg via ORAL
  Filled 2020-04-18 (×3): qty 1

## 2020-04-18 MED ORDER — ONDANSETRON HCL 4 MG/2ML IJ SOLN
4.0000 mg | Freq: Four times a day (QID) | INTRAMUSCULAR | Status: DC | PRN
  Administered 2020-04-18 – 2020-04-20 (×4): 4 mg via INTRAVENOUS
  Filled 2020-04-18 (×4): qty 2

## 2020-04-18 MED ORDER — AZITHROMYCIN 500 MG IV SOLR
500.0000 mg | INTRAVENOUS | Status: DC
  Administered 2020-04-19 – 2020-04-20 (×2): 500 mg via INTRAVENOUS
  Filled 2020-04-18 (×3): qty 500

## 2020-04-18 MED ORDER — SODIUM CHLORIDE 0.9 % IV SOLN: INTRAVENOUS | Status: DC

## 2020-04-18 MED ORDER — SODIUM CHLORIDE 0.9 % IV SOLN
1.0000 g | Freq: Once | INTRAVENOUS | Status: AC
  Administered 2020-04-18: 06:00:00 1 g via INTRAVENOUS
  Filled 2020-04-18: qty 10

## 2020-04-18 MED ORDER — IOHEXOL 350 MG/ML SOLN
100.0000 mL | Freq: Once | INTRAVENOUS | Status: AC | PRN
  Administered 2020-04-18: 05:00:00 100 mL via INTRAVENOUS

## 2020-04-18 MED ORDER — IPRATROPIUM-ALBUTEROL 0.5-2.5 (3) MG/3ML IN SOLN
3.0000 mL | Freq: Four times a day (QID) | RESPIRATORY_TRACT | Status: DC | PRN
  Administered 2020-04-18: 23:00:00 3 mL via RESPIRATORY_TRACT
  Filled 2020-04-18: qty 3

## 2020-04-18 NOTE — Progress Notes (Signed)
Pt reported chest tightness and SOB unresolved throughout the day. RT notified for PRN neb. Will continue to assess

## 2020-04-18 NOTE — ED Notes (Signed)
Pt given ice chips. Md stated it is Ok for pt to have ice chips.

## 2020-04-18 NOTE — H&P (Signed)
History and Physical    LETCHER SCHWEIKERT RKY:[id number] DOB: 24-Jul-1995 DOA: 04/18/2020  PCP: Clovis Riley, L.August Saucer, MD   Patient coming from: Home  I have personally briefly reviewed patient's old medical records in [facility]  Chief Complaint: Shortness of breath and syncope.  HPI: Anthony Ponce is a 24 y.o. male with medical history significant of tobacco abuse, history of Crohn's disease, cirrhosis and marijuana use; who presented to the hospital after experiencing an event of passing out.  Patient also reporting intermittent dry coughing spells with blood tinge sputum and active hematemesis.  Symptoms have been present on and off for the last 2-3 days and worsening after syncopal event on the day of admission.  He denies any fever, chills, chest pain, abdominal pain, melena or hematochezia.  Reports that he has not been actively following by GI service as an outpatient and is currently not taking medications for Crohn's or for his cirrhosis.  No sick contacts according to patient reports.  ED Course: Mildly hypoxic with oxygen saturation 8687 on room air on presentation to the ED; elevated WBCs, mild transaminitis (similar to previous records), normal PT/INR, negative Covid, positive active hematemesis and hemoptysis.  Chest x-ray not impressive.  CT chest negative for pulmonary embolism but suggesting acute atypical community-acquired pneumonia versus alveolar hemorrhage.  [facility] hospitalist has been contacted to place patient in the hospital for further evaluation and management..  IV fluids and antibiotics has been started in the ED.  Review of Systems: As per HPI otherwise all other systems reviewed and are negative.  Past Medical History:  Diagnosis Date  . Cirrhosis (HCC)   . Crohn disease (HCC)   . Liver damage    Pt goes to [facility]    Past Surgical History:  Procedure Laterality Date  . COLONOSCOPY W/ BIOPSIES    . ESOPHAGOSCOPY      Social History  reports  that he has been smoking cigarettes. He has been smoking about 0.50 packs per day. He has never used smokeless tobacco. He reports current alcohol use. He reports that he does not use drugs.  No Known Allergies  Family history: Patient reported history of hypertension on his parents; otherwise noncontributory.     Prior to Admission medications   Not on File    Physical Exam: Vitals:   04/18/20 0530 04/18/20 0555 04/18/20 0630 04/18/20 0741  BP: 114/72  108/70   Pulse: 93 74 91   Resp: (!) 22  19   Temp:      TempSrc:      SpO2: (!) 87% 98% 90% 90%  Weight:      Height:        Constitutional: In mild distress secondary to ongoing hematemesis events.  Afebrile and denying chest pain. Vitals:   04/18/20 0530 04/18/20 0555 04/18/20 0630 04/18/20 0741  BP: 114/72  108/70   Pulse: 93 74 91   Resp: (!) 22  19   Temp:      TempSrc:      SpO2: (!) 87% 98% 90% 90%  Weight:      Height:       Eyes: PERRL, lids and conjunctivae normal, no icterus, no nystagmus. ENMT: Mucous membranes are slightly dry on examination. Posterior pharynx clear of any exudate or lesions.N Neck: normal, supple, no masses, no thyromegaly, no JVD. Respiratory: Positive scattered rhonchi; no wheezing, no crackles appreciated on examination.  Patient is no using accessory muscle. Cardiovascular: Regular rate and rhythm, no murmurs /  rubs / gallops. No extremity edema. 2+ pedal pulses. No carotid bruits.  Abdomen: no tenderness, no masses palpated. No hepatosplenomegaly. Bowel sounds positive.  Musculoskeletal: no clubbing / cyanosis. No joint deformity upper and lower extremities. Good ROM, no contractures. Normal muscle tone.  Skin: no rashes, lesions, ulcers. No induration Neurologic: CN 2-12 grossly intact. Sensation intact, DTR normal. Strength 5/5 in all 4.  Psychiatric: Alert and oriented x 3. Normal mood.    Labs on Admission: I have personally reviewed following labs and imaging  studies  CBC: Recent Labs  Lab 04/18/20 0413  WBC 18.1*  NEUTROABS 15.3*  HGB 16.7  HCT 47.2  MCV 87.4  PLT 563    Basic Metabolic Panel: Recent Labs  Lab 04/18/20 0413  NA 137  K 3.6  CL 104  CO2 25  GLUCOSE 121*  BUN 13  CREATININE 0.98  CALCIUM 9.1    GFR: Estimated Creatinine Clearance: 120.4 mL/min (by C-G formula based on SCr of 0.98 mg/dL).  Liver Function Tests: Recent Labs  Lab 04/18/20 0413  AST 173*  ALT 217*  ALKPHOS 111  BILITOT 0.6  PROT 7.4  ALBUMIN 4.2    Urine analysis:    Component Value Date/Time   COLORURINE YELLOW 04/20/2016 0032   APPEARANCEUR HAZY (A) 04/20/2016 0032   LABSPEC 1.016 04/20/2016 0032   PHURINE 7.0 04/20/2016 0032   GLUCOSEU NEGATIVE 04/20/2016 0032   HGBUR NEGATIVE 04/20/2016 0032   BILIRUBINUR NEGATIVE 04/20/2016 0032   KETONESUR NEGATIVE 04/20/2016 0032   PROTEINUR NEGATIVE 04/20/2016 0032   UROBILINOGEN 1.0 01/02/2015 0326   NITRITE NEGATIVE 04/20/2016 0032   LEUKOCYTESUR NEGATIVE 04/20/2016 0032    Radiological Exams on Admission: CT Head Wo Contrast  Result Date: 04/18/2020 CLINICAL DATA:  Fall, head injury EXAM: CT HEAD WITHOUT CONTRAST TECHNIQUE: Contiguous axial images were obtained from the base of the skull through the vertex without intravenous contrast. COMPARISON:  None. FINDINGS: Brain: Normal anatomic configuration. No abnormal intra or extra-axial mass lesion or fluid collection. No abnormal mass effect or midline shift. No evidence of acute intracranial hemorrhage or infarct. Ventricular size is normal. Cerebellum unremarkable. Vascular: Unremarkable Skull: Intact Sinuses/Orbits: Small mucous retention cyst within the visualized right maxillary sinus. Remaining paranasal sinuses are clear. Orbits are unremarkable. Other: Mastoid air cells and middle ear cavities are clear. IMPRESSION: No acute intracranial abnormality.  No calvarial fracture. Electronically Signed   By: Fidela Salisbury MD   On:  04/18/2020 05:29   CT Angio Chest PE W and/or Wo Contrast  Result Date: 04/18/2020 CLINICAL DATA:  Hemoptysis EXAM: CT ANGIOGRAPHY CHEST WITH CONTRAST TECHNIQUE: Multidetector CT imaging of the chest was performed using the standard protocol during bolus administration of intravenous contrast. Multiplanar CT image reconstructions and MIPs were obtained to evaluate the vascular anatomy. CONTRAST:  138mL OMNIPAQUE IOHEXOL 350 MG/ML SOLN COMPARISON:  None. FINDINGS: Cardiovascular: Satisfactory opacification of the pulmonary arteries to the segmental level. No evidence of pulmonary embolism. Normal heart size. No pericardial effusion. Mediastinum/Nodes: Residual thymic tissue noted within the anterior mediastinum. Thyroid unremarkable. No pathologic thoracic adenopathy. Esophagus is unremarkable. Lungs/Pleura: There is extensive centrilobular ground-glass pulmonary infiltrate seen diffusely in the lungs but more severe within the a right upper lobe. In the acute setting, this likely reflects changes related to atypical infection. Pulmonary hemorrhage, however, could result in a similar appearance in the acute setting. There is superimposed bronchial wall thickening noted diffusely in keeping with airway inflammation. 6 mm pulmonary nodule within the right lower lobe,  axial image # 86, is nonspecific and may relate to the patient's underlying acute pathology. No pneumothorax or pleural effusion. No central obstructing lesion. Upper Abdomen: Unremarkable Musculoskeletal: No acute bone abnormality. Review of the MIP images confirms the above findings. IMPRESSION: Extensive centrilobular pulmonary infiltrate most in keeping with changes of atypical infection in the acute setting with associated bronchial wall thickening reflecting airway inflammation. Note that acute hemorrhage could also result in a similar appearance, though the diffuse nature of the infiltrates makes this less likely to represent the primary  pathologic process. No pulmonary embolism. Electronically Signed   By: Fidela Salisbury MD   On: 04/18/2020 05:42   DG Chest Port 1 View  Result Date: 04/18/2020 CLINICAL DATA:  Cough EXAM: PORTABLE CHEST 1 VIEW COMPARISON:  None. FINDINGS: The heart size and mediastinal contours are within normal limits. Increased interstitial markings are seen throughout both lungs, right greater than left. Increased reticulonodular opacity seen within the perihilar regions. No large airspace consolidation or pleural effusion. No acute osseous abnormality. IMPRESSION: Increased interstitial markings throughout both lungs could be due to reactive airway disease or infectious etiology. Electronically Signed   By: Prudencio Pair M.D.   On: 04/18/2020 03:54    EKG: Independently reviewed.  No signs of significant ischemic changes; sinus rhythm, normal QT.  Assessment/Plan 1-acute respiratory failure with hypoxia  -Patient with oxygen saturation of 86% on room air; good saturation with 2 L supplementation -Concerns for pneumonia and possible alveolar disease base on CT chest findings. -Case has been discussed with pulmonology service who will see patient -Start oral antibiotics for empirical treatment of pneumonia -As needed bronchodilators and oxygen supplementation will be provided. -Incentive spirometer and flutter valve has been ordered -Will check lupus panel, anti-GBM, sedimentation rate, urinalysis, ANCA antibodies/titers, urinalysis with reflex microscopy, strep pneumoniae urinary antigen and also Legionella urine antigen. -Based on patient progression and clinical response my ended requiring to be transferred to [facility] for bronchoscopy evaluation. -Continue supportive care and as needed antitussive medications. -Covid PCR was negative  2-syncopal event -Vasovagal in nature most likely -No acute or normalities appreciated on telemetry while in ED her EKG -Will check on telemetry for the next  24-48 hours for any abnormalities -Provide fluid resuscitation -Control patient's coughing spells -Will check TSH.  3-history of cirrhosis/Crohn's disease -No actively taking any medications or seen anyone 52 -Patient has been encourage to closely follow-up with GI service as an outpatient for further evaluation and management of his conditions. -Continue PPI.  4-tobacco abuse/marijuana use -Cessation counseling provided -nicotine patch has been ordered  5-leukocytosis -In the setting of a stress demargination and pneumonia -IV fluids will be provided -Patient has been started on antibiotics-we will follow WBCs trend.  6-fecal occult blood test positive -Patient with normal PT/INR -Normal platelets count and a stable hemoglobin -He is currently hemodynamically stable and per discussion with GI service history/presentation suggesting Mallory-Weiss -Continue full liquid diet and PPI twice a day -Unless overt bleeding happened there will be not need for endoscopic evaluation as an inpatient. -Heparin products will be avoided currently -Using SCDs for DVT prophylaxis.  7-transaminitis  -In the setting of prior history of cirrhosis -Based on previous labs LFTs appears to be within his normal limits. -continue to follow LFT's    DVT prophylaxis: SCDs Code Status:   Full code Family Communication:  No family at bedside. Disposition Plan:   Patient is from:  Home   Anticipated DC to:  Home   Anticipated  DC date:  To be determined.   Anticipated DC barriers: Stabilization of patient hypoxia/pneumonia with demonstrated capacity to tolerate p.o.'s.  Consults called:  GI service curbside (at this moment plan is to use oral PPI twice a day and full liquid diet initially; history and presentation suggesting Mallory-Weiss and if there is no significant overt bleeding there will be not need for endoscopic evaluation as an inpatient).  Pulmonology service: Dr. Melvyn Novas, given CT chest  findings there is concerns for potential alveolar disease.   Admission status:  Inpatient, length of stay more than 2 midnights; telemetry bed.  Severity of Illness: Moderate illness; patient presenting with acute respiratory failure with hypoxia in the setting of community-acquired pneumonia; they have been concern for hemoptysis and possible upper GI bleed. Patient has underlying history of cirrhosis and Crohn's for his not receiving any treatment or actively follow with anyone is a moderate risk for decompensation. He will be admitted to the hospital for treatment of his pneumonia, close monitoring of hemoglobin and further evaluation for possible alveolar disease.    Barton Dubois MD [facility]  How to contact the [facility] Attending or Consulting provider [place] or covering provider during after hours [place], for this patient?   1. Check the care team in [facility] and look for a) attending/consulting [facility] provider listed and b) the [facility] team listed 2. Log into [email or web address] and use [place]'s universal password to access. If you do not have the password, please contact the hospital operator. 3. Locate the [facility] provider you are looking for under [facility] and page to a number that you can be directly reached. 4. If you still have difficulty reaching the provider, please page the [facility] (Director on Call) for the Hospitalists listed on amion for assistance.  04/18/2020, 8:23 AM

## 2020-04-18 NOTE — Consult Note (Signed)
NAME:  Anthony Ponce, MRN:  [id number], DOB:  [birth year], LOS: 0 ADMISSION DATE:  04/18/2020, CONSULTATION DATE:  12/30 REFERRING MD:  [facility], CHIEF COMPLAINT:  hemoptysis   Brief History:  7 yowm with crohn's / cirrhosis with "typical flare" starting 12/22 which is treats with MJ (no GI meds or f/u) and acute onset n/v pm 12/29 then passed out doing video games and when came to with "vomiting and coughing up blood" and came to ER am 12/30 with CTa c/w diffuse GG changes c/w alv hem though hgb 16.7 and PCCM asked to see am 12/30   History of Present Illness:   24 y.o. male with medical history significant of tobacco abuse, history of Crohn's disease, cirrhosis and marijuana use; who presented to the hospital after experiencing an event of passing out.  Patient also reporting intermittent dry coughing spells with blood tinge sputum and active hematemesis.  Symptoms have been present on and off for the last 2-3 days and worsening after syncopal event on the day of admission.  He denied any fever, chills, chest pain, abdominal pain, melena or hematochezia.  Reports that he had not been actively following by GI service as an outpatient and is currently not taking medications for Crohn's or for his cirrhosis.  No sick contacts according to patient reports.  ED Course: Mildly hypoxic with oxygen saturation 86/87% on room air on presentation to the ED; elevated WBCs, mild transaminitis (similar to previous records), normal PT/INR, negative Covid, positive active hematemesis and hemoptysis.  Chest x-ray not impressive.  CT chest negative for pulmonary embolism but suggesting acute atypical community-acquired pneumonia versus alveolar hemorrhage.  [facility] hospitalist has been contacted to place patient in the hospital for further evaluation and management..  IV fluids and antibiotics has been started in the ED.  Past Medical History:  Crohns  Cirrhosis  Significant Hospital Events:    Consults:   PCCM am 12/30 Resp viral panel PCR 12/30  Neg flu/ neg Covid  Procedures:    Significant Diagnostic Tests:  CTa 12/30:  Extensive centrilobular pulmonary infiltrate most in keeping with changes of atypical infection in the acute setting with associated bronchial wall thickening reflecting airway inflammation. Note that acute hemorrhage could also result in a similar appearance, though the diffuse nature of the infiltrates makes this less likely to represent the primary pathologic process. No pulmonary embolism. Anti GBM ab 12/30 ANCA screen 12/30 ANA 12/30 U/A 12/30 UDS   Micro Data:  Quant GOLD TB  12/30   Antimicrobials:  Zmax 12/30  Rocephine 12/30   Scheduled Meds: . nicotine  14 mg Transdermal Daily  . pantoprazole  40 mg Oral BID   Continuous Infusions: . sodium chloride 75 mL/hr at 04/18/20 0922  . azithromycin    . cefTRIAXone (ROCEPHIN)  IV Stopped (04/18/20 1119)   PRN Meds:.guaiFENesin-dextromethorphan, ipratropium-albuterol, ondansetron (ZOFRAN) IV    Interim History / Subjective:  C/o nausea, dry mouth, no cp or sob   Objective   Blood pressure 114/76, pulse 76, temperature 98.3 F (36.8 C), temperature source Oral, resp. rate 11, height _0  (1.778 m), weight 72.6 kg, SpO2 99 %.    FiO2 (%):  [28 %] 28 %   Intake/Output Summary (Last 24 hours) at 04/18/2020 1426 Last data filed at 04/18/2020 1119 Gross per 24 hour  Intake 450 ml  Output --  Net 450 ml   Filed Weights   04/18/20 0321  Weight: 72.6 kg    Examination:Tmax 98.3  General:  does not look acutely ill  Lungs: minimal insp/exp rhonchi  Cardiovascular: RRR no s3 Abdomen: sobf Extremities: warm, no clubbing Neuro: intact    Resolved Hospital Problem list      Assessment & Plan:    DDx for diffuse pulmonary infiltrates: Miscellaneous:Alv microlithiasis, alv proteinosis, asp, bronchiectais, BOOP  (all three fit with inflammatory bowel dz though more common with UC but  hold steroids for now since ESR so low)  ARDS/ AIP Occupational dz/ HSP Neoplasm Infection  Atypical pna > check urine Ag's,  emprically cover CAP, check Quant Gold TB Drug esp cocaine which he denies or impurities in MJ he admits smoking unfiltered heavily to ease his crohn's dz pain > UDS pending  Pulmonary emboli, Protein disorders Edema/Eosinophilic dz Sarcoidosis Connective tissue dz Hist X / Hemorrhage > doubt Alv hem/ vasculitis with ESR only 2 but sent serologies  Idiopathic     2) Acute resp failure/ hypoxemia  > supplemental 02 to sats > 90%     Best practice (evaluated daily)  Diet: per [facility] Pain/Anxiety/Delirium protocol (if indicated): per [facility]  VAP protocol (if indicated): n/a DVT prophylaxis:  PAS only  GI prophylaxis: per [facility]/ gi  Glucose control: n/a  Mobility: up as tol Disposition:telemetry ok for now, to [facility] if deteriorates      Labs   CBC: Recent Labs  Lab 04/18/20 0413  WBC 18.1*  NEUTROABS 15.3*  HGB 16.7  HCT 47.2  MCV 87.4  PLT 633    Basic Metabolic Panel: Recent Labs  Lab 04/18/20 0413 04/18/20 0801  NA 137  --   K 3.6  --   CL 104  --   CO2 25  --   GLUCOSE 121*  --   BUN 13  --   CREATININE 0.98  --   CALCIUM 9.1  --   MG  --  1.9  PHOS  --  4.1   GFR: Estimated Creatinine Clearance: 120.4 mL/min (by C-G formula based on SCr of 0.98 mg/dL). Recent Labs  Lab 04/18/20 0413  WBC 18.1*    Liver Function Tests: Recent Labs  Lab 04/18/20 0413  AST 173*  ALT 217*  ALKPHOS 111  BILITOT 0.6  PROT 7.4  ALBUMIN 4.2   No results for input(s): LIPASE, AMYLASE in the last 168 hours. No results for input(s): AMMONIA in the last 168 hours.  ABG No results found for: PHART, PCO2ART, PO2ART, HCO3, TCO2, ACIDBASEDEF, O2SAT   Coagulation Profile: Recent Labs  Lab 04/18/20 0413  INR 1.0    Cardiac Enzymes: No results for input(s): CKTOTAL, CKMB, CKMBINDEX, TROPONINI in the last 168 hours.  HbA1C: No results  found for: HGBA1C  CBG: No results for input(s): GLUCAP in the last 168 hours.  Review of Systems:      Past Medical History:  He,  has a past medical history of Cirrhosis ([place]), Crohn disease ([place]), and Liver damage.   Surgical History:   Past Surgical History:  Procedure Laterality Date  . COLONOSCOPY W/ BIOPSIES    . ESOPHAGOSCOPY       Social History:   reports that he has been smoking cigarettes. He has been smoking about 0.50 packs per day. He has never used smokeless tobacco. He reports current alcohol use. He reports that he does not use drugs.   Family History:  His family history is not on file.   Allergies No Known Allergies   Home Medications  Prior to Admission medications   Not on File  Christinia Gully, MD Pulmonary and [place] [phone]   After 7:00 pm call Elink  [phone]

## 2020-04-18 NOTE — ED Triage Notes (Signed)
Pt woke up and started coughing up blood.

## 2020-04-18 NOTE — ED Notes (Signed)
Patient transported to CT 

## 2020-04-18 NOTE — ED Triage Notes (Signed)
Pt states that he fell today and has a knot on the back of his head.

## 2020-04-18 NOTE — ED Provider Notes (Signed)
[facility] EMERGENCY DEPARTMENT Provider Note   CSN: [id number] Arrival date & time: 04/18/20  0309     History Chief Complaint  Patient presents with  . Coughing up Blood    Anthony Ponce is a 24 y.o. male.  The history is provided by the patient.  Cough Cough characteristics:  Productive Sputum characteristics:  Bloody Severity:  Moderate Onset quality:  Sudden Timing:  Intermittent Progression:  Worsening Chronicity:  New Smoker: yes   Relieved by:  None tried Worsened by:  Nothing Associated symptoms: chest pain   Patient with reported history of Crohn's and "cirrhosis" presents for reportedly coughing up blood.  Patient reports he was playing a video game when he passed out.  He reports he woke up and was coughing and vomiting blood He also reports some chest pain and shortness of breath.  He also reports hitting his head and he has a headache. He admits to marijuana abuse. Prior to this episode, he has not had any recent cough or cold symptoms No known history of PE/DVT. He is not on any current medications for his Crohn's disease.     Past Medical History:  Diagnosis Date  . Cirrhosis (HCC)   . Crohn disease (HCC)   . Liver damage    Pt goes to [facility]    There are no problems to display for this patient.   Past Surgical History:  Procedure Laterality Date  . COLONOSCOPY W/ BIOPSIES    . ESOPHAGOSCOPY         History reviewed. No pertinent family history.  Social History   Tobacco Use  . Smoking status: Current Every Day Smoker    Packs/day: 0.50    Types: Cigarettes  . Smokeless tobacco: Never Used  Substance Use Topics  . Alcohol use: Yes    Comment: liquor on 09/25/15 unknown amount  . Drug use: No    Home Medications Prior to Admission medications   Not on File    Allergies    Patient has no known allergies.  Review of Systems   Review of Systems  Respiratory: Positive for cough.        Hemoptysis    Cardiovascular: Positive for chest pain.  Gastrointestinal: Positive for vomiting. Negative for blood in stool.  Neurological: Positive for syncope.  All other systems reviewed and are negative.   Physical Exam Updated Vital Signs BP 103/73   Pulse 93   Temp 98.3 F (36.8 C) (Oral)   Resp 18   Ht 1.778 m (5\' 10" )   Wt 72.6 kg   SpO2 91%   BMI 22.96 kg/m   Physical Exam CONSTITUTIONAL:disheveled, ill appearing HEAD: Cephalhematoma noted to scalp.  Diffuse tenderness noted. EYES: EOMI/PERRL ENMT: Mucous membranes moist, no epistaxis, blood in mouth, no visible trauma.  No active bleeding in oropharynx NECK: supple no meningeal signs SPINE/BACK:entire spine nontender CV: S1/S2 noted, no murmurs/rubs/gallops noted LUNGS: Coarse breath sounds bilaterally, hypoxic to the 80s on room air ABDOMEN: soft, nontender, no rebound or guarding, bowel sounds noted throughout abdomen GU:no cva tenderness Rectal-no blood or melena noted, nurse present for exam NEURO: Pt is awake/alert/appropriate, moves all extremitiesx4.  No facial droop.   EXTREMITIES: pulses normal/equal, full ROM, no deformities SKIN: warm, color normal PSYCH: no abnormalities of mood noted, alert and oriented to situation  ED Results / Procedures / Treatments   Labs (all labs ordered are listed, but only abnormal results are displayed) Labs Reviewed  CBC WITH DIFFERENTIAL/PLATELET - Abnormal;  Notable for the following components:      Result Value   WBC 18.1 (*)    Neutro Abs 15.3 (*)    Monocytes Absolute 1.4 (*)    Abs Immature Granulocytes 0.10 (*)    All other components within normal limits  COMPREHENSIVE METABOLIC PANEL - Abnormal; Notable for the following components:   Glucose, Bld 121 (*)    AST 173 (*)    ALT 217 (*)    All other components within normal limits  POC OCCULT BLOOD, ED - Abnormal; Notable for the following components:   Fecal Occult Bld POSITIVE (*)    All other components within  normal limits  RESP PANEL BY RT-PCR (FLU A&B, COVID) ARPGX2  PROTIME-INR  ETHANOL  RAPID URINE DRUG SCREEN, HOSP PERFORMED  POC SARS CORONAVIRUS 2 AG -  ED  TYPE AND SCREEN    EKG EKG Interpretation  Date/Time:  Thursday April 18 2020 03:29:32 EST Ventricular Rate:  92 PR Interval:    QRS Duration: 108 QT Interval:  341 QTC Calculation: 422 R Axis:   88 Text Interpretation: Sinus rhythm No significant change since last tracing Confirmed by Zadie Rhine ([id number]) on 04/18/2020 3:34:50 AM   Radiology CT Head Wo Contrast  Result Date: 04/18/2020 CLINICAL DATA:  Fall, head injury EXAM: CT HEAD WITHOUT CONTRAST TECHNIQUE: Contiguous axial images were obtained from the base of the skull through the vertex without intravenous contrast. COMPARISON:  None. FINDINGS: Brain: Normal anatomic configuration. No abnormal intra or extra-axial mass lesion or fluid collection. No abnormal mass effect or midline shift. No evidence of acute intracranial hemorrhage or infarct. Ventricular size is normal. Cerebellum unremarkable. Vascular: Unremarkable Skull: Intact Sinuses/Orbits: Small mucous retention cyst within the visualized right maxillary sinus. Remaining paranasal sinuses are clear. Orbits are unremarkable. Other: Mastoid air cells and middle ear cavities are clear. IMPRESSION: No acute intracranial abnormality.  No calvarial fracture. Electronically Signed   By: Helyn Numbers MD   On: 04/18/2020 05:29   CT Angio Chest PE W and/or Wo Contrast  Result Date: 04/18/2020 CLINICAL DATA:  Hemoptysis EXAM: CT ANGIOGRAPHY CHEST WITH CONTRAST TECHNIQUE: Multidetector CT imaging of the chest was performed using the standard protocol during bolus administration of intravenous contrast. Multiplanar CT image reconstructions and MIPs were obtained to evaluate the vascular anatomy. CONTRAST:  OMNIPAQUE IOHEXOL 350 MG/ML SOLN COMPARISON:  None. FINDINGS: Cardiovascular: Satisfactory opacification of  the pulmonary arteries to the segmental level. No evidence of pulmonary embolism. Normal heart size. No pericardial effusion. Mediastinum/Nodes: Residual thymic tissue noted within the anterior mediastinum. Thyroid unremarkable. No pathologic thoracic adenopathy. Esophagus is unremarkable. Lungs/Pleura: There is extensive centrilobular ground-glass pulmonary infiltrate seen diffusely in the lungs but more severe within the a right upper lobe. In the acute setting, this likely reflects changes related to atypical infection. Pulmonary hemorrhage, however, could result in a similar appearance in the acute setting. There is superimposed bronchial wall thickening noted diffusely in keeping with airway inflammation. 6 mm pulmonary nodule within the right lower lobe, axial image # 86, is nonspecific and may relate to the patient's underlying acute pathology. No pneumothorax or pleural effusion. No central obstructing lesion. Upper Abdomen: Unremarkable Musculoskeletal: No acute bone abnormality. Review of the MIP images confirms the above findings. IMPRESSION: Extensive centrilobular pulmonary infiltrate most in keeping with changes of atypical infection in the acute setting with associated bronchial wall thickening reflecting airway inflammation. Note that acute hemorrhage could also result in a similar appearance, though the diffuse nature  of the infiltrates makes this less likely to represent the primary pathologic process. No pulmonary embolism. Electronically Signed   By: Helyn Numbers MD   On: 04/18/2020 05:42   DG Chest Port 1 View  Result Date: 04/18/2020 CLINICAL DATA:  Cough EXAM: PORTABLE CHEST 1 VIEW COMPARISON:  None. FINDINGS: The heart size and mediastinal contours are within normal limits. Increased interstitial markings are seen throughout both lungs, right greater than left. Increased reticulonodular opacity seen within the perihilar regions. No large airspace consolidation or pleural effusion. No  acute osseous abnormality. IMPRESSION: Increased interstitial markings throughout both lungs could be due to reactive airway disease or infectious etiology. Electronically Signed   By: Jonna Clark M.D.   On: 04/18/2020 03:54    Procedures .Critical Care Performed by: Zadie Rhine, MD Authorized by: Zadie Rhine, MD   Critical care provider statement:    Critical care time (minutes):  70   Critical care start time:  04/18/2020 4:20 AM   Critical care end time:  04/18/2020 5:30 AM   Critical care was necessary to treat or prevent imminent or life-threatening deterioration of the following conditions:  CNS failure or compromise and respiratory failure   Critical care was time spent personally by me on the following activities:  Evaluation of patient's response to treatment, review of old charts, re-evaluation of patient's condition, pulse oximetry, ordering and review of radiographic studies, ordering and review of laboratory studies, ordering and performing treatments and interventions, examination of patient and discussions with consultants   I assumed direction of critical care for this patient from another provider in my specialty: no     Care discussed with: admitting provider     Medications Ordered in ED Medications  cefTRIAXone (ROCEPHIN) 1 g in sodium chloride 0.9 % 100 mL IVPB (1 g Intravenous New Bag/Given 04/18/20 0601)  azithromycin (ZITHROMAX) 500 mg in sodium chloride 0.9 % 250 mL IVPB (has no administration in time range)  iohexol (OMNIPAQUE) 350 MG/ML injection 100 mL (100 mLs Intravenous Contrast Given 04/18/20 0518)    ED Course  I have reviewed the triage vital signs and the nursing notes.  Pertinent labs & imaging results that were available during my care of the patient were reviewed by me and considered in my medical decision making (see chart for details).    MDM Rules/Calculators/A&P                          4:21 AM Patient reports he was playing a  video game when he had a syncopal event and woke up coughing and vomiting blood.  Patient is a poor historian.  However it does appear that he has been coughing up blood and he did have some hypoxia on arrival that is improving. However he also reported a history of vomiting blood and previous history of inflammatory bowel disease and self reports h/o cirrhosis.  No recent GI evaluations in the records  Due to history of syncope and hitting his head with headache, will obtain CT head  Due to hemoptysis and shortness of breath and hypoxia, will proceed with CT angio chest to evaluate for PE We will also monitor for any signs of acute GI bleed.  We will follow closely 6:31 AM CT head was reviewed and is negative.  CT chest is negative for pulmonary embolism, but does reveal acute infection versus pulmonary hemorrhage Patient also has a new oxygen requirement Patient will need to be admitted to  the hospital for further monitoring and evaluation.  We will start IV antibiotics, and I would recommend pulmonology consultation later in the morning Patient keeps reporting he is vomiting blood, but on exam it appears that he is having hemoptysis No focal abdominal tenderness.  Vitals otherwise stable at this time  D/w dr Thomes Dinning for admission  BASCOM BIEL was evaluated in Emergency Department on 04/18/2020 for the symptoms described in the history of present illness. He was evaluated in the context of the global COVID-19 pandemic, which necessitated consideration that the patient might be at risk for infection with the SARS-CoV-2 virus that causes COVID-19. Institutional protocols and algorithms that pertain to the evaluation of patients at risk for COVID-19 are in a state of rapid change based on information released by regulatory bodies including the CDC and federal and state organizations. These policies and algorithms were followed during the patient's care in the ED.  Final Clinical Impression(s) /  ED Diagnoses Final diagnoses:  Hemoptysis  Acute respiratory failure with hypoxia (HCC)  Community acquired pneumonia of right upper lobe of lung    Rx / DC Orders ED Discharge Orders    None       Zadie Rhine, MD 04/18/20 [phone]

## 2020-04-18 NOTE — ED Notes (Signed)
PT sleeping and sats were 88% on RA. Placed pt on 2L of o2. sats came up to 99 %

## 2020-04-19 ENCOUNTER — Inpatient Hospital Stay (HOSPITAL_COMMUNITY): Payer: Self-pay

## 2020-04-19 DIAGNOSIS — Z8719 Personal history of other diseases of the digestive system: Secondary | ICD-10-CM

## 2020-04-19 DIAGNOSIS — J189 Pneumonia, unspecified organism: Principal | ICD-10-CM

## 2020-04-19 LAB — BASIC METABOLIC PANEL
Anion gap: 6 (ref 5–15)
BUN: 14 mg/dL (ref 6–20)
CO2: 26 mmol/L (ref 22–32)
Calcium: 8.6 mg/dL — ABNORMAL LOW (ref 8.9–10.3)
Chloride: 103 mmol/L (ref 98–111)
Creatinine, Ser: 0.9 mg/dL (ref 0.61–1.24)
GFR, Estimated: >60 mL/min (ref >60)
Glucose, Bld: 146 mg/dL — ABNORMAL HIGH (ref 70–99)
Potassium: 3.5 mmol/L (ref 3.5–5.1)
Sodium: 135 mmol/L (ref 135–145)

## 2020-04-19 LAB — CBC
HCT: 40.7 % (ref 39.0–52.0)
Hemoglobin: 13.6 g/dL (ref 13.0–17.0)
MCH: 30.2 pg (ref 26.0–34.0)
MCHC: 33.4 g/dL (ref 30.0–36.0)
MCV: 90.4 fL (ref 80.0–100.0)
Platelets: 161 10*3/uL (ref 150–400)
RBC: 4.5 MIL/uL (ref 4.22–5.81)
RDW: 12.1 % (ref 11.5–15.5)
WBC: 5 10*3/uL (ref 4.0–10.5)
nRBC: 0 % (ref 0.0–0.2)

## 2020-04-19 LAB — LUPUS ANTICOAGULANT PANEL
DRVVT: 38.5 s (ref 0.0–47.0)
PTT Lupus Anticoagulant: 30.2 s (ref 0.0–51.9)

## 2020-04-19 LAB — LEGIONELLA PNEUMOPHILA SEROGP 1 UR AG: L. pneumophila Serogp 1 Ur Ag: NEGATIVE

## 2020-04-19 LAB — PROCALCITONIN: Procalcitonin: <0.1 ng/mL

## 2020-04-19 MED ORDER — DM-GUAIFENESIN ER 30-600 MG PO TB12
2.0000 | ORAL_TABLET | Freq: Two times a day (BID) | ORAL | Status: DC
  Administered 2020-04-19 – 2020-04-20 (×3): 2 via ORAL
  Filled 2020-04-19 (×3): qty 2

## 2020-04-19 MED ORDER — PANTOPRAZOLE SODIUM 40 MG PO TBEC
40.0000 mg | DELAYED_RELEASE_TABLET | Freq: Two times a day (BID) | ORAL | Status: DC
  Administered 2020-04-19 – 2020-04-20 (×2): 40 mg via ORAL
  Filled 2020-04-19 (×2): qty 1

## 2020-04-19 NOTE — Progress Notes (Signed)
PROGRESS NOTE    Anthony Ponce  FFM:[id number] DOB: 29-Oct-1995 DOA: 04/18/2020 PCP: Alroy Dust, L.Marlou Sa, MD    Chief Complaint  Patient presents with  .  SOB, Coughing up Blood, hematemesis and syncope.    Brief Narrative:  Anthony Ponce is a 24 y.o. male with medical history significant of tobacco abuse, history of Crohn's disease, cirrhosis and marijuana use; who presented to the hospital after experiencing an event of passing out.  Patient also reporting intermittent dry coughing spells with blood tinge sputum and active hematemesis.  Symptoms have been present on and off for the last 2-3 days and worsening after syncopal event on the day of admission.  He denies any fever, chills, chest pain, abdominal pain, melena or hematochezia.  Reports that he has not been actively following by GI service as an outpatient and is currently not taking medications for Crohn's or for his cirrhosis.  No sick contacts according to patient reports.  ED Course: Mildly hypoxic with oxygen saturation 8687 on room air on presentation to the ED; elevated WBCs, mild transaminitis (similar to previous records), normal PT/INR, negative Covid, positive active hematemesis and hemoptysis.  Chest x-ray not impressive.  CT chest negative for pulmonary embolism but suggesting acute atypical community-acquired pneumonia versus alveolar hemorrhage.  [facility] hospitalist has been contacted to place patient in the hospital for further evaluation and management..  IV fluids and antibiotics has been started in the ED.  Assessment & Plan: 1-acute respiratory failure with hypoxia-oxygen saturation down to 86% on room air at time of admission -Currently in the mid 90s while resting with slight desaturation with activity requiring intermittent use of 2 L nasal cannula supplementation. -Patient reports no chest pain, no palpitations, and overall improvement in his symptoms. -Continue empirical treatment for community-acquired  pneumonia -Follow WBCs trend; patient is afebrile. -Continue to follow recommendation by pulmonologist and final results of lupus panel, anti-GBM, ANCA antibodies/titers, strep pneumo urine antigen and also Legionella urine antigen. -ESR and procalcitonin results are reassuring. -Urinalysis demonstrating no presence of blood. -Follow results of QuantiFERON gold.  2-syncope -Appears to be vasovagal in nature -No normalities appreciated on EKG or telemetry -Observe on the telemetry monitoring for another 24 hours. -Continue to provide adequate hydration -Patient is specific quality suggesting dehydration).  3-history of cirrhosis/Crohn's disease -No actively taking any medications or seen any gastroenterologist currently. -Appointment will be arranged for patient to follow-up with GI service in [place] at discharge -Reports no abdominal pain currently and no further episode of nausea/vomiting.  4-tobacco abuse/marijuana use -Cessation counseling provided -patient has declined the use of nicotine patch.  5-GERD/hematemesis/fecal occult blood test positive -Normal PT/INR -Normal platelet count -Continue to follow hemoglobin trend -Positive fecal occult blood toss could be associated with underlying history of Crohn disease not necessarily the presence of overt bleeding at this time. -Continue PPI twice a day -Presentation of his hematemesis suggesting Mallory-Weiss. -as long as patient remains hemodynamically stable and no frank signs of overt bleeding, -no endoscopic evaluation will be needed. -case discussed with GI.  6-pneumonitis -In the setting of prior history of cirrhosis. -Continue to follow LFTs stability. -LFT's appears to be at baseline for him; outpatient follow-up with GI service.  DVT prophylaxis: SCDs. Code Status: Full code Family Communication: No family at bedside. Disposition:   Status is: Inpatient  Dispo: The patient is from: Home               Anticipated d/c is to: Home  Anticipated d/c date is: 04/21/2019; unless further invasive testing needed.              Patient currently almost medically stable for discharge; will continue antibiotics, advance diet to assess tolerance, continue to follow hemoglobin and stability and further response from the hemoptysis standpoint.  There is significant amount of pending serology/blood work and will follow results to determine the need for further invasive evaluation while inpatient.     Consultants:   Pulmonologist  -GI service was curbside to arrange outpatient follow-up.   Procedures:  See below for x-ray reports.  Antimicrobials:  Rocephin/Zithromax  Subjective: No fever, reports no chest pain, no further episode of nausea or hematemesis.  Still complaining of intermittent coughing spells with blood-tinged sputum production.  Overnight with chest tightness and shortness of breath requiring breathing treatment and the use of 2 L nasal cannula supplementation.  Objective: Vitals:   04/18/20 1830 04/18/20 2101 04/18/20 2306 04/19/20 0604  BP: 113/65 122/68  (!) 101/53  Pulse: 86 61  76  Resp: _0 Temp: 98.4 F (36.9 C) 98.6 F (37 C)  98 F (36.7 C)  TempSrc: Oral   Oral  SpO2: 100% 100% 96% 100%  Weight:      Height:        Intake/Output Summary (Last 24 hours) at 04/19/2020 0859 Last data filed at 04/18/2020 1600 Gross per 24 hour  Intake 1830 ml  Output --  Net 1830 ml   Filed Weights   04/18/20 0321  Weight: 72.6 kg    Examination:  General exam: Appears calm and comfortable; reports chest tightness and shortness of breath overnight.  No further episode of nausea/hematemesis; reports still some intermittent blood tinged sputum production and coughing spells. Respiratory system: Positive scattered rhonchi; no crackles, no wheezing, no using accessory muscle.  Good oxygen saturation appreciated on room air while resting and intermittently using  2 L nasal cannula with activity. Cardiovascular system: S1 & S2 heard, RRR. No JVD, murmurs, rubs, gallops or clicks. No pedal edema. Gastrointestinal system: Abdomen is nondistended, soft and nontender. No organomegaly or masses felt. Normal bowel sounds heard. Central nervous system: Alert and oriented. No focal neurological deficits. Extremities: Symmetric 5 x 5 power. Skin: No rashes, lesions or ulcers Psychiatry: Judgement and insight appear normal. Mood & affect appropriate.     Data Reviewed: I have personally reviewed following labs and imaging studies  CBC: Recent Labs  Lab 04/18/20 0413  WBC 18.1*  NEUTROABS 15.3*  HGB 16.7  HCT 47.2  MCV 87.4  PLT 734    Basic Metabolic Panel: Recent Labs  Lab 04/18/20 0413 04/18/20 0801  NA 137  --   K 3.6  --   CL 104  --   CO2 25  --   GLUCOSE 121*  --   BUN 13  --   CREATININE 0.98  --   CALCIUM 9.1  --   MG  --  1.9  PHOS  --  4.1    GFR: Estimated Creatinine Clearance: 120.4 mL/min (by C-G formula based on SCr of 0.98 mg/dL).  Liver Function Tests: Recent Labs  Lab 04/18/20 0413  AST 173*  ALT 217*  ALKPHOS 111  BILITOT 0.6  PROT 7.4  ALBUMIN 4.2    CBG: No results for input(s): GLUCAP in the last 168 hours.   Recent Results (from the past 240 hour(s))  Resp Panel by RT-PCR (Flu A&B, Covid) Nasopharyngeal Swab     Status:  None   Collection Time: 04/18/20  4:50 AM   Specimen: Nasopharyngeal Swab; Nasopharyngeal(NP) swabs in vial transport medium  Result Value Ref Range Status   SARS Coronavirus 2 by RT PCR NEGATIVE NEGATIVE Final    Comment: (NOTE) SARS-CoV-2 target nucleic acids are NOT DETECTED.  The SARS-CoV-2 RNA is generally detectable in upper respiratory specimens during the acute phase of infection. The lowest concentration of SARS-CoV-2 viral copies this assay can detect is 138 copies/mL. A negative result does not preclude SARS-Cov-2 infection and should not be used as the sole basis  for treatment or other patient management decisions. A negative result may occur with  improper specimen collection/handling, submission of specimen other than nasopharyngeal swab, presence of viral mutation(s) within the areas targeted by this assay, and inadequate number of viral copies(<138 copies/mL). A negative result must be combined with clinical observations, patient history, and epidemiological information. The expected result is Negative.  Fact Sheet for Patients:  EntrepreneurPulse.com.au  Fact Sheet for Healthcare Providers:  IncredibleEmployment.be  This test is no t yet approved or cleared by the [place] FDA and  has been authorized for detection and/or diagnosis of SARS-CoV-2 by FDA under an Emergency Use Authorization (EUA). This EUA will remain  in effect (meaning this test can be used) for the duration of the COVID-19 declaration under Section 564(b)(1) of the Act, 21 U.S.C.section 360bbb-3(b)(1), unless the authorization is terminated  or revoked sooner.       Influenza A by PCR NEGATIVE NEGATIVE Final   Influenza B by PCR NEGATIVE NEGATIVE Final    Comment: (NOTE) The Xpert Xpress SARS-CoV-2/FLU/RSV plus assay is intended as an aid in the diagnosis of influenza from Nasopharyngeal swab specimens and should not be used as a sole basis for treatment. Nasal washings and aspirates are unacceptable for Xpert Xpress SARS-CoV-2/FLU/RSV testing.  Fact Sheet for Patients: EntrepreneurPulse.com.au  Fact Sheet for Healthcare Providers: IncredibleEmployment.be  This test is not yet approved or cleared by the [place] FDA and has been authorized for detection and/or diagnosis of SARS-CoV-2 by FDA under an Emergency Use Authorization (EUA). This EUA will remain in effect (meaning this test can be used) for the duration of the COVID-19 declaration under Section 564(b)(1) of the Act, 21  U.S.C. section 360bbb-3(b)(1), unless the authorization is terminated or revoked.  Performed at [facility], [address], [place], [place] [address]      Radiology Studies: CT Head Wo Contrast  Result Date: 04/18/2020 CLINICAL DATA:  Fall, head injury EXAM: CT HEAD WITHOUT CONTRAST TECHNIQUE: Contiguous axial images were obtained from the base of the skull through the vertex without intravenous contrast. COMPARISON:  None. FINDINGS: Brain: Normal anatomic configuration. No abnormal intra or extra-axial mass lesion or fluid collection. No abnormal mass effect or midline shift. No evidence of acute intracranial hemorrhage or infarct. Ventricular size is normal. Cerebellum unremarkable. Vascular: Unremarkable Skull: Intact Sinuses/Orbits: Small mucous retention cyst within the visualized right maxillary sinus. Remaining paranasal sinuses are clear. Orbits are unremarkable. Other: Mastoid air cells and middle ear cavities are clear. IMPRESSION: No acute intracranial abnormality.  No calvarial fracture. Electronically Signed   By: Fidela Salisbury MD   On: 04/18/2020 05:29   CT Angio Chest PE W and/or Wo Contrast  Result Date: 04/18/2020 CLINICAL DATA:  Hemoptysis EXAM: CT ANGIOGRAPHY CHEST WITH CONTRAST TECHNIQUE: Multidetector CT imaging of the chest was performed using the standard protocol during bolus administration of intravenous contrast. Multiplanar CT image reconstructions and MIPs were obtained to  evaluate the vascular anatomy. CONTRAST:  120m OMNIPAQUE IOHEXOL 350 MG/ML SOLN COMPARISON:  None. FINDINGS: Cardiovascular: Satisfactory opacification of the pulmonary arteries to the segmental level. No evidence of pulmonary embolism. Normal heart size. No pericardial effusion. Mediastinum/Nodes: Residual thymic tissue noted within the anterior mediastinum. Thyroid unremarkable. No pathologic thoracic adenopathy. Esophagus is unremarkable. Lungs/Pleura: There is extensive centrilobular  ground-glass pulmonary infiltrate seen diffusely in the lungs but more severe within the a right upper lobe. In the acute setting, this likely reflects changes related to atypical infection. Pulmonary hemorrhage, however, could result in a similar appearance in the acute setting. There is superimposed bronchial wall thickening noted diffusely in keeping with airway inflammation. 6 mm pulmonary nodule within the right lower lobe, axial image # 86, is nonspecific and may relate to the patient's underlying acute pathology. No pneumothorax or pleural effusion. No central obstructing lesion. Upper Abdomen: Unremarkable Musculoskeletal: No acute bone abnormality. Review of the MIP images confirms the above findings. IMPRESSION: Extensive centrilobular pulmonary infiltrate most in keeping with changes of atypical infection in the acute setting with associated bronchial wall thickening reflecting airway inflammation. Note that acute hemorrhage could also result in a similar appearance, though the diffuse nature of the infiltrates makes this less likely to represent the primary pathologic process. No pulmonary embolism. Electronically Signed   By: AFidela SalisburyMD   On: 04/18/2020 05:42   DG Chest Port 1 View  Result Date: 04/18/2020 CLINICAL DATA:  Cough EXAM: PORTABLE CHEST 1 VIEW COMPARISON:  None. FINDINGS: The heart size and mediastinal contours are within normal limits. Increased interstitial markings are seen throughout both lungs, right greater than left. Increased reticulonodular opacity seen within the perihilar regions. No large airspace consolidation or pleural effusion. No acute osseous abnormality. IMPRESSION: Increased interstitial markings throughout both lungs could be due to reactive airway disease or infectious etiology. Electronically Signed   By: BPrudencio PairM.D.   On: 04/18/2020 03:54    Scheduled Meds: . nicotine  14 mg Transdermal Daily  . pantoprazole  40 mg Oral BID   Continuous  Infusions: . sodium chloride 75 mL/hr at 04/18/20 1600  . azithromycin 500 mg (04/19/20 0859)  . cefTRIAXone (ROCEPHIN)  IV 2 g (04/19/20 0815)     LOS: 1 day    Time spent: 30 minutes    CBarton Dubois MD [facility]   To contact the attending provider between 7A-7P or the covering provider during after hours 7P-7A, please log into the web site [email or web address] and access using universal [place] password for that web site. If you do not have the password, please call the hospital operator.  04/19/2020, 8:59 AM

## 2020-04-19 NOTE — Progress Notes (Signed)
Initial Nutrition Assessment  DOCUMENTATION CODES:   Not applicable  INTERVENTION:  Boost Breeze po BID, each supplement provides 250 kcal and 9 grams of protein  Ensure Enlive po BID, each supplement provides 350 kcal and 20 grams of protein  Magic cup BID with meals, each supplement provides 290 kcal and 9 grams of protein  MVI with minerals daily   Orders have been reconciled for discharge, unable to provide interventions at this time.   NUTRITION DIAGNOSIS:   Inadequate oral intake related to nausea,vomiting,decreased appetite as evidenced by per patient/family report.    GOAL:   Patient will meet greater than or equal to 90% of their needs    MONITOR:   PO intake,Weight trends,Labs,I & O's,Supplement acceptance  REASON FOR ASSESSMENT:   Malnutrition Screening Tool    ASSESSMENT:  24 year old male admitted with pneumonia presented after episode of syncope and reports 2-3 days of intermittent dry coughing spells with blood tinge sputum and active hematemesis. Past medical history significant of Crohn's disease, cirrhosis, and marijuana use.  Patient awake, sitting up in bed watching television this morning. Reports feeling much better today, denied further episodes of nausea, vomiting. He endorses poor po over the past couple of days, recalls eating a bowl of white rice the day prior to presenting to ED. Patient reports Crohn's diagnosed at 5, stopped taking medications and is not followed by GI. Patient recalls eating mostly fast food, sometimes will have a salad, drinks mostly sweet tea, or diet Coke, some water. Patient reports usual 2 bowel movements weekly, often is constipated. RD educated on the importance of adequate fluid intake, encouraged limiting sweet tea, increasing water. Discussed possibility of reducing frequency of eating  fast foods, discussed strategies for gradually increasing fiber if not have having inflammation, stressed the importance of  consuming adequate water intake. He endorses ~5-7 lb wt loss in the last week, usual weight 168-170 lbs over the last few years. Weights have have decreased 8 lbs (4.8%) from usual weight in the last week which is significant for time frame. He is agreeable to [employer] and Ensure supplements to help him meet his needs.     NUTRITION - FOCUSED PHYSICAL EXAM: Completed - no fat/muscle depletions identified   Diet Order:   Diet Order            DIET SOFT Room service appropriate? Yes; Fluid consistency: Thin  Diet effective now                 EDUCATION NEEDS:   Education needs have been addressed  Skin:  Skin Assessment: Reviewed RN Assessment  Last BM:  12/29  Height:   Ht Readings from Last 1 Encounters:  04/18/20 5\' 10"  (1.778 m)    Weight:   Wt Readings from Last 1 Encounters:  04/18/20 72.6 kg    BMI:  Body mass index is 22.96 kg/m.  Estimated Nutritional Needs:   Kcal:  2200-2400  Protein:  110-125  Fluid:  >/= 2.2 L   04/20/20, RD, LDN Clinical Nutrition After Hours/Weekend Pager # in Amion

## 2020-04-19 NOTE — Progress Notes (Addendum)
NAME:  Anthony Ponce, MRN:  [id number], DOB:  1996/04/07, LOS: 1 ADMISSION DATE:  04/18/2020, CONSULTATION DATE:  12/30 REFERRING MD:  [place], CHIEF COMPLAINT:  hemoptysis   Brief History:  2 yowm with crohn's / cirrhosis with "typical flare" starting 12/22 which he treats with MJ (no GI meds or f/u) and acute onset n/v pm 12/29 then passed out doing video games and when came to with "vomiting and coughing up blood" and came to ER am 12/30 with CTa c/w diffuse GG changes c/w alv hem though hgb 16.7 and PCCM asked to see am 12/30   History of Present Illness:   24 y.o. male with medical history significant of tobacco abuse, history of Crohn's disease, cirrhosis and marijuana use; who presented to the hospital after experiencing an event of passing out.  Patient also reporting intermittent dry coughing spells with blood tinge sputum and active hematemesis.  Symptoms have been present on and off for the last 2-3 days and worsening after syncopal event on the day of admission.  He denied any fever, chills, chest pain, abdominal pain, melena or hematochezia.  Reports that he had not been actively following by GI service as an outpatient and is currently not taking medications for Crohn's or for his cirrhosis.  No sick contacts according to patient reports.  ED Course: Mildly hypoxic with oxygen saturation 86/87% on room air on presentation to the ED; elevated WBCs, mild transaminitis (similar to previous records), normal PT/INR, negative Covid, positive active hematemesis and hemoptysis.  Chest x-ray not impressive.  CT chest negative for pulmonary embolism but suggesting acute atypical community-acquired pneumonia versus alveolar hemorrhage.  [facility] hospitalist has been contacted to place patient in the hospital for further evaluation and management..  IV fluids and antibiotics has been started in the ED.  Past Medical History:  Crohns  Cirrhosis  Significant Hospital Events:    Consults:   PCCM am 12/30  Procedures:    Significant Diagnostic Tests:  CTa 12/30:  Extensive centrilobular pulmonary infiltrate most in keeping with changes of atypical infection in the acute setting with associated bronchial wall thickening reflecting airway inflammation. Note that acute hemorrhage could also result in a similar appearance, though the diffuse nature of the infiltrates makes this less likely to represent the primary pathologic process. No pulmonary embolism. Anti GBM ab 12/30 ANCA screen 12/30 ANA 12/30 U/A 12/30  Neg for hgb with SG > 1.046  UDS >  pos for THC only   Micro Data:  Resp viral panel PCR 12/30 >  Neg flu/ neg Covid Quant GOLD TB  12/30 >>>    Antimicrobials:  Zmax 12/30  Rocephine 12/30   Scheduled Meds: . nicotine  14 mg Transdermal Daily  . pantoprazole  40 mg Oral BID   Continuous Infusions: . sodium chloride 75 mL/hr at 04/18/20 1600  . azithromycin 500 mg (04/19/20 0859)  . cefTRIAXone (ROCEPHIN)  IV 2 g (04/19/20 0815)   PRN Meds:.guaiFENesin-dextromethorphan, ipratropium-albuterol, ondansetron (ZOFRAN) IV    Interim History / Subjective:   much better, some chest discomfort midline only coughing / blood is darker and only comes up during vigorous coughing efforts   Objective   Blood pressure (!) 101/53, pulse 76, temperature 98 F (36.7 C), temperature source Oral, resp. rate 16, height 5' 10"  (1.778 m), weight 72.6 kg, SpO2 100 %.        Intake/Output Summary (Last 24 hours) at 04/19/2020 1008 Last data filed at 04/18/2020 1600 Gross per 24 hour  Intake 1580 ml  Output --  Net 1580 ml   Filed Weights   04/18/20 0321  Weight: 72.6 kg    Examination: Tmax  98.6 sats 100% on 2lpm NP Pt alert, approp nad @ sitting up in bed  No jvd Oropharynx clear,  mucosa nl Neck supple Lungs with minimal  scattered exp > insp rhonchi bilaterally RRR no s3 or or sign murmur Abd soft with nl  excursion  Extr warm with no edema or  clubbing noted Neuro  Sensorium intact,  no apparent motor deficits     I personally reviewed images and agree with radiology impression as follows:  CXR:   PA and lateral 12/31 The heart size and mediastinal contours are within normal limits. Both lungs are clear. The visualized skeletal structures are unremarkable.    Resolved Hospital Problem list      Assessment & Plan:    DDx for diffuse pulmonary infiltrates: Miscellaneous:Alv microlithiasis, alv proteinosis, Asp, bronchiectais, BOOP  (all three fit with inflammatory bowel dz though more common with UC - still would  hold steroids for now since ESR so low)  ARDS/ AIP Occupational dz/ HSP - no known risk factors Neoplasm Infection  Atypical pna > check urine Ag's when available,  emprically cover CAP, check Quant Gold TB pending  Drug esp  impurities in MJ he admits smoking unfiltered heavily to ease his crohn's dz pain   Pulmonary emboli, Protein disorders Edema - needs bnp to be complete> check am 1/1 Eosinophilic dz>  Eos 0 peripherally Sarcoidosis Connective tissue dz> w/u in progress  Hist X / Hemorrhage > doubt Alv hem/ vasculitis with ESR only 2 but sent serologies 12/30 and repeat cbc am 12/31 pending Idiopathic    >>>> recheck Pa and lateral cxr today, bnp/cbc 1/1    2) Acute resp failure/ hypoxemia - no worse overnight in terms of sats or fio2 required  > supplemental 02 to sats > 90%   If hemoptysis/ resp failure worsen over the holiday will need to consider [facility] or [facility] as no PCCM service on site available but happy to try to consult over the phone  If better needs over needs f/u ov with me in 2 weeks     Best practice (evaluated daily)  Diet: per [facility] Pain/Anxiety/Delirium protocol (if indicated): per [facility]  VAP protocol (if indicated): n/a DVT prophylaxis:  PAS only  GI prophylaxis: PPI Glucose control: n/a  Mobility: up as tol Disposition:telemetry ok  for now, to [facility] if deteriorates      Labs   CBC: Recent Labs  Lab 04/18/20 0413  WBC 18.1*  NEUTROABS 15.3*  HGB 16.7  HCT 47.2  MCV 87.4  PLT 353    Basic Metabolic Panel: Recent Labs  Lab 04/18/20 0413 04/18/20 0801  NA 137  --   K 3.6  --   CL 104  --   CO2 25  --   GLUCOSE 121*  --   BUN 13  --   CREATININE 0.98  --   CALCIUM 9.1  --   MG  --  1.9  PHOS  --  4.1   GFR: Estimated Creatinine Clearance: 120.4 mL/min (by C-G formula based on SCr of 0.98 mg/dL). Recent Labs  Lab 04/18/20 0413 04/18/20 0932 04/19/20 0455  PROCALCITON  --  <0.10 <0.10  WBC 18.1*  --   --     Liver Function Tests: Recent Labs  Lab 04/18/20  0413  AST 173*  ALT 217*  ALKPHOS 111  BILITOT 0.6  PROT 7.4  ALBUMIN 4.2   No results for input(s): LIPASE, AMYLASE in the last 168 hours. No results for input(s): AMMONIA in the last 168 hours.  ABG No results found for: PHART, PCO2ART, PO2ART, HCO3, TCO2, ACIDBASEDEF, O2SAT   Coagulation Profile: Recent Labs  Lab 04/18/20 0413  INR 1.0    Cardiac Enzymes: No results for input(s): CKTOTAL, CKMB, CKMBINDEX, TROPONINI in the last 168 hours.  HbA1C: No results found for: HGBA1C  CBG: No results for input(s): GLUCAP in the last 168 hours.     Christinia Gully, MD Pulmonary and [place] [phone]   After 7:00 pm call Elink  [phone]

## 2020-04-20 DIAGNOSIS — K7469 Other cirrhosis of liver: Secondary | ICD-10-CM

## 2020-04-20 DIAGNOSIS — K50919 Crohn's disease, unspecified, with unspecified complications: Secondary | ICD-10-CM

## 2020-04-20 DIAGNOSIS — Z720 Tobacco use: Secondary | ICD-10-CM

## 2020-04-20 DIAGNOSIS — F1210 Cannabis abuse, uncomplicated: Secondary | ICD-10-CM

## 2020-04-20 LAB — CBC
HCT: 40.2 % (ref 39.0–52.0)
Hemoglobin: 14 g/dL (ref 13.0–17.0)
MCH: 31.3 pg (ref 26.0–34.0)
MCHC: 34.8 g/dL (ref 30.0–36.0)
MCV: 89.7 fL (ref 80.0–100.0)
Platelets: 169 10*3/uL (ref 150–400)
RBC: 4.48 MIL/uL (ref 4.22–5.81)
RDW: 12 % (ref 11.5–15.5)
WBC: 4.5 10*3/uL (ref 4.0–10.5)
nRBC: 0 % (ref 0.0–0.2)

## 2020-04-20 LAB — PROCALCITONIN: Procalcitonin: <0.1 ng/mL

## 2020-04-20 LAB — BRAIN NATRIURETIC PEPTIDE: B Natriuretic Peptide: 33 pg/mL (ref 0.0–100.0)

## 2020-04-20 MED ORDER — NICOTINE 14 MG/24HR TD PT24: 14.0000 mg | MEDICATED_PATCH | Freq: Every day | TRANSDERMAL | 1 refills | Status: DC

## 2020-04-20 MED ORDER — AMOXICILLIN-POT CLAVULANATE 875-125 MG PO TABS: 1.0000 | ORAL_TABLET | Freq: Two times a day (BID) | ORAL | 0 refills | Status: AC

## 2020-04-20 MED ORDER — ONDANSETRON 8 MG PO TBDP
8.0000 mg | ORAL_TABLET | Freq: Three times a day (TID) | ORAL | 0 refills | Status: DC | PRN
Start: 2020-04-20 — End: 2020-11-21

## 2020-04-20 MED ORDER — DM-GUAIFENESIN ER 30-600 MG PO TB12: 1.0000 | ORAL_TABLET | Freq: Two times a day (BID) | ORAL | 0 refills | Status: AC

## 2020-04-20 MED ORDER — PANTOPRAZOLE SODIUM 40 MG PO TBEC: 40.0000 mg | DELAYED_RELEASE_TABLET | Freq: Every day | ORAL | 1 refills | Status: DC

## 2020-04-20 NOTE — TOC Transition Note (Signed)
Transition of Care [facility]) - CM/SW Discharge Note   Patient Details  Name: Anthony Ponce MRN: [id number] Date of Birth: December 18, 1995  Transition of Care [facility]) CM/SW Contact:  Barry Brunner, LCSW Phone Number: 04/20/2020, 12:52 PM   Clinical Narrative:    Patient requested PCP list. CSW provided PCP list to patient and referred patient to medicare.gov for reviews of PCP's. TOC signing off.   Final next level of care: Home/Self Care Barriers to Discharge: Barriers Resolved   Patient Goals and CMS Choice Patient states their goals for this hospitalization and ongoing recovery are:: Return home CMS Medicare.gov Compare Post Acute Care list provided to:: Patient Choice offered to / list presented to : Patient  Discharge Placement                    Patient and family notified of of transfer: 04/20/20  Discharge Plan and Services                DME Arranged: N/A DME Agency: NA       HH Arranged: NA HH Agency: NA        Social Determinants of Health (SDOH) Interventions     Readmission Risk Interventions No flowsheet data found.

## 2020-04-20 NOTE — Discharge Summary (Signed)
Physician Discharge Summary  Anthony Ponce:[id number] DOB: 05-31-1995 DOA: 04/18/2020  PCP: Alroy Dust, L.Marlou Sa, MD  Admit date: 04/18/2020 Discharge date: 04/20/2020  Time spent: 35 minutes  Recommendations for Outpatient Follow-up:  1. Repeat CBC to follow trend 2. Repeat basic electrolytes and renal function  Discharge Diagnoses:  Active Problems:   Community acquired pneumonia of right upper lobe of lung   Pulmonary infiltrates   Acute respiratory failure with hypoxia (HCC)   Hemoptysis   Crohn's disease with complication ([place])   Other cirrhosis of liver ([place])   Tobacco abuse   Marijuana abuse   Discharge Condition: Stable and improved.  Discharged home with instruction to follow-up with pulmonology and GI service.  CODE STATUS: Full code  Diet recommendation: Regular diet.  Filed Weights   04/18/20 0321  Weight: 72.6 kg    History of present illness:  Anthony Ponce a 25 y.o.malewith medical history significant oftobacco abuse, history of Crohn's disease, cirrhosis and marijuana use; who presented to the hospital after experiencing an event of passing out. Patient also reporting intermittent dry coughing spells with blood tinge sputum and active hematemesis. Symptoms have been present on and off for the last 2-3 days and worsening after syncopal event on the day of admission. He denies any fever, chills, chest pain, abdominal pain, melena or hematochezia.Reports that he has not been actively following by GI service as an outpatient and is currently not taking medications for Crohn's or for his cirrhosis. No sick contacts according to patient reports.  ED Course:Mildly hypoxic with oxygen saturation 8687 on room air on presentation to the ED; elevated WBCs, mild transaminitis (similar to previous records), normal PT/INR, negative Covid, positive active hematemesis and hemoptysis. Chest x-ray not impressive. CT chest negative for pulmonary embolism but  suggesting acute atypical community-acquired pneumonia versus alveolar hemorrhage. [facility] hospitalist has been contacted to place patient in the hospital for further evaluation and management.. IV fluids and antibiotics has been started in the ED.  Hospital Course:  1-acute respiratory failure with hypoxia -Patient reports no chest pain, no palpitations, and overall improvement/almost complete resolution in his symptoms. -Continue empirical treatment for community-acquired pneumonia; discharged for Augmentin to complete therapy. -WBCs within normal limits, patient was afebrile and not requiring oxygen supplementation at discharge. -Patient will follow up with allergist in 2 weeks. -ESR and procalcitonin results are reassuring. -Urinalysis demonstrating no presence of blood. -Follow results of QuantiFERON gold and any other pending serology.  2-syncope -Appears to be vasovagal in nature -No normalities appreciated on EKG or telemetry -Patient advised to maintain adequate hydration. -No further syncope or lightheadedness event reported  3-history of cirrhosis/Crohn's disease -No actively taking any medications or seen any gastroenterologist currently. -Appointment will be arranged for patient to follow-up with GI service in [place] at discharge -Reports no abdominal pain currently and no further episode of nausea/vomiting. -As needed Zofran prescribed.  4-tobacco abuse/marijuana use -Cessation counseling provided -patient has has been discharge with prescription for nicotine patch. -Patient is interested and looking to quit.  5-GERD/hematemesis/fecal occult blood test positive -Normal PT/INR -Normal platelet count -Continue to follow hemoglobin trend with repeat CBC at follow-up visit. -Positive fecal occult blood test could be associated with underlying history of Crohn's disease and not necessarily the presence of overt bleeding at this time. -Continue PPI daily at  discharge. -Presentation of his hematemesis events suggesting Mallory-Weiss. -no endoscopic evaluation needed during hospitalization -case discussed with GI.  6-transaminitis -In the setting of prior history of cirrhosis. -Continue to  follow LFTs stability. -L outpatient follow-up with GI service.   Procedures: See below for x-ray report.  Consultations:  Pulmonology  GI  Discharge Exam: Vitals:   04/19/20 2126 04/20/20 0640  BP: 103/62 115/74  Pulse: 99 (!) 55  Resp: 18 18  Temp: 98.1 F (36.7 C) 97.7 F (36.5 C)  SpO2: 98% 97%    General: Afebrile, no chest pain, no nausea vomiting.  Patient reports that usually usual intermittent blood-tinged sputum when coughing.  Feeling ready to go home. Cardiovascular: S1 and S2, no rubs, no gallops, no JVD. Respiratory: Good air movement bilaterally, no using accessory muscle, normal respiratory effort and not requiring O2 supplementation Abdomen: Soft, nontender, nondistended, positive bowel sounds Extremities: No cyanosis or clubbing.  Discharge Instructions   Discharge Instructions    Discharge instructions   Complete by: As directed    Stop smoking and stop the use of recreational drugs Maintain adequate hydration Take medications as prescribed Follow-up with Dr. Christinia Gully (pulmonologist) in 2 weeks Follow-up with gastroenterology service (office will contact you with appointment details). Establish care with a PCP and follow with him for preventive visits and check ups.     Allergies as of 04/20/2020   No Known Allergies     Medication List    STOP taking these medications   HYDROcodone-acetaminophen 5-325 MG tablet Commonly known as: NORCO/VICODIN   meloxicam 15 MG tablet Commonly known as: MOBIC   methocarbamol 500 MG tablet Commonly known as: ROBAXIN     TAKE these medications   amoxicillin-clavulanate 875-125 MG tablet Commonly known as: Augmentin Take 1 tablet by mouth 2 (two) times daily  for 5 days.   dextromethorphan-guaiFENesin 30-600 MG 12hr tablet Commonly known as: MUCINEX DM Take 1 tablet by mouth 2 (two) times daily for 10 days.   nicotine 14 mg/24hr patch Commonly known as: NICODERM CQ - dosed in mg/24 hours Place 1 patch (14 mg total) onto the skin daily. Start taking on: April 21, 2020   ondansetron 8 MG disintegrating tablet Commonly known as: Zofran ODT Take 1 tablet (8 mg total) by mouth every 8 (eight) hours as needed for nausea or vomiting. What changed:   medication strength  how much to take   pantoprazole 40 MG tablet Commonly known as: PROTONIX Take 1 tablet (40 mg total) by mouth daily.      No Known Allergies  Follow-up Information    Tanda Rockers, MD. Schedule an appointment as soon as possible for a visit in 2 week(s).   Specialty: Pulmonary Disease Contact information: [address] [place] [address] [phone]        Rogene Houston, MD Follow up today.   Specialty: Gastroenterology Why: office will contact you with appointment details. Contact information: [address], [address] [place] [address] [phone]               The results of significant diagnostics from this hospitalization (including imaging, microbiology, ancillary and laboratory) are listed below for reference.    Significant Diagnostic Studies: DG Chest 2 View  Result Date: 04/19/2020 CLINICAL DATA:  Hemoptysis. EXAM: CHEST - 2 VIEW COMPARISON:  April 18, 2020. FINDINGS: The heart size and mediastinal contours are within normal limits. Both lungs are clear. The visualized skeletal structures are unremarkable. IMPRESSION: No active cardiopulmonary disease. Electronically Signed   By: Marijo Conception M.D.   On: 04/19/2020 11:02   CT Head Wo Contrast  Result Date: 04/18/2020 CLINICAL  DATA:  Fall, head injury EXAM: CT HEAD WITHOUT CONTRAST TECHNIQUE: Contiguous axial images were obtained from the base of the skull  through the vertex without intravenous contrast. COMPARISON:  None. FINDINGS: Brain: Normal anatomic configuration. No abnormal intra or extra-axial mass lesion or fluid collection. No abnormal mass effect or midline shift. No evidence of acute intracranial hemorrhage or infarct. Ventricular size is normal. Cerebellum unremarkable. Vascular: Unremarkable Skull: Intact Sinuses/Orbits: Small mucous retention cyst within the visualized right maxillary sinus. Remaining paranasal sinuses are clear. Orbits are unremarkable. Other: Mastoid air cells and middle ear cavities are clear. IMPRESSION: No acute intracranial abnormality.  No calvarial fracture. Electronically Signed   By: Fidela Salisbury MD   On: 04/18/2020 05:29   CT Angio Chest PE W and/or Wo Contrast  Result Date: 04/18/2020 CLINICAL DATA:  Hemoptysis EXAM: CT ANGIOGRAPHY CHEST WITH CONTRAST TECHNIQUE: Multidetector CT imaging of the chest was performed using the standard protocol during bolus administration of intravenous contrast. Multiplanar CT image reconstructions and MIPs were obtained to evaluate the vascular anatomy. CONTRAST:  193m OMNIPAQUE IOHEXOL 350 MG/ML SOLN COMPARISON:  None. FINDINGS: Cardiovascular: Satisfactory opacification of the pulmonary arteries to the segmental level. No evidence of pulmonary embolism. Normal heart size. No pericardial effusion. Mediastinum/Nodes: Residual thymic tissue noted within the anterior mediastinum. Thyroid unremarkable. No pathologic thoracic adenopathy. Esophagus is unremarkable. Lungs/Pleura: There is extensive centrilobular ground-glass pulmonary infiltrate seen diffusely in the lungs but more severe within the a right upper lobe. In the acute setting, this likely reflects changes related to atypical infection. Pulmonary hemorrhage, however, could result in a similar appearance in the acute setting. There is superimposed bronchial wall thickening noted diffusely in keeping with airway inflammation. 6  mm pulmonary nodule within the right lower lobe, axial image # 86, is nonspecific and may relate to the patient's underlying acute pathology. No pneumothorax or pleural effusion. No central obstructing lesion. Upper Abdomen: Unremarkable Musculoskeletal: No acute bone abnormality. Review of the MIP images confirms the above findings. IMPRESSION: Extensive centrilobular pulmonary infiltrate most in keeping with changes of atypical infection in the acute setting with associated bronchial wall thickening reflecting airway inflammation. Note that acute hemorrhage could also result in a similar appearance, though the diffuse nature of the infiltrates makes this less likely to represent the primary pathologic process. No pulmonary embolism. Electronically Signed   By: AFidela SalisburyMD   On: 04/18/2020 05:42   DG Chest Port 1 View  Result Date: 04/18/2020 CLINICAL DATA:  Cough EXAM: PORTABLE CHEST 1 VIEW COMPARISON:  None. FINDINGS: The heart size and mediastinal contours are within normal limits. Increased interstitial markings are seen throughout both lungs, right greater than left. Increased reticulonodular opacity seen within the perihilar regions. No large airspace consolidation or pleural effusion. No acute osseous abnormality. IMPRESSION: Increased interstitial markings throughout both lungs could be due to reactive airway disease or infectious etiology. Electronically Signed   By: BPrudencio PairM.D.   On: 04/18/2020 03:54    Microbiology: Recent Results (from the past 240 hour(s))  Resp Panel by RT-PCR (Flu A&B, Covid) Nasopharyngeal Swab     Status: None   Collection Time: 04/18/20  4:50 AM   Specimen: Nasopharyngeal Swab; Nasopharyngeal(NP) swabs in vial transport medium  Result Value Ref Range Status   SARS Coronavirus 2 by RT PCR NEGATIVE NEGATIVE Final    Comment: (NOTE) SARS-CoV-2 target nucleic acids are NOT DETECTED.  The SARS-CoV-2 RNA is generally detectable in upper  respiratory specimens during the acute phase  of infection. The lowest concentration of SARS-CoV-2 viral copies this assay can detect is 138 copies/mL. A negative result does not preclude SARS-Cov-2 infection and should not be used as the sole basis for treatment or other patient management decisions. A negative result may occur with  improper specimen collection/handling, submission of specimen other than nasopharyngeal swab, presence of viral mutation(s) within the areas targeted by this assay, and inadequate number of viral copies(<138 copies/mL). A negative result must be combined with clinical observations, patient history, and epidemiological information. The expected result is Negative.  Fact Sheet for Patients:  EntrepreneurPulse.com.au  Fact Sheet for Healthcare Providers:  IncredibleEmployment.be  This test is no t yet approved or cleared by the [place] FDA and  has been authorized for detection and/or diagnosis of SARS-CoV-2 by FDA under an Emergency Use Authorization (EUA). This EUA will remain  in effect (meaning this test can be used) for the duration of the COVID-19 declaration under Section 564(b)(1) of the Act, 21 U.S.C.section 360bbb-3(b)(1), unless the authorization is terminated  or revoked sooner.       Influenza A by PCR NEGATIVE NEGATIVE Final   Influenza B by PCR NEGATIVE NEGATIVE Final    Comment: (NOTE) The Xpert Xpress SARS-CoV-2/FLU/RSV plus assay is intended as an aid in the diagnosis of influenza from Nasopharyngeal swab specimens and should not be used as a sole basis for treatment. Nasal washings and aspirates are unacceptable for Xpert Xpress SARS-CoV-2/FLU/RSV testing.  Fact Sheet for Patients: EntrepreneurPulse.com.au  Fact Sheet for Healthcare Providers: IncredibleEmployment.be  This test is not yet approved or cleared by the [place] FDA and has been  authorized for detection and/or diagnosis of SARS-CoV-2 by FDA under an Emergency Use Authorization (EUA). This EUA will remain in effect (meaning this test can be used) for the duration of the COVID-19 declaration under Section 564(b)(1) of the Act, 21 U.S.C. section 360bbb-3(b)(1), unless the authorization is terminated or revoked.  Performed at [facility], [address] [place], [place] [address]      Labs: Basic Metabolic Panel: Recent Labs  Lab 04/18/20 0413 04/18/20 0801 04/19/20 0943  NA 137  --  135  K 3.6  --  3.5  CL 104  --  103  CO2 25  --  26  GLUCOSE 121*  --  146*  BUN 13  --  14  CREATININE 0.98  --  0.90  CALCIUM 9.1  --  8.6*  MG  --  1.9  --   PHOS  --  4.1  --    Liver Function Tests: Recent Labs  Lab 04/18/20 0413  AST 173*  ALT 217*  ALKPHOS 111  BILITOT 0.6  PROT 7.4  ALBUMIN 4.2   CBC: Recent Labs  Lab 04/18/20 0413 04/19/20 0943 04/20/20 0619  WBC 18.1* 5.0 4.5  NEUTROABS 15.3*  --   --   HGB 16.7 13.6 14.0  HCT 47.2 40.7 40.2  MCV 87.4 90.4 89.7  PLT 202 161 169    BNP (last 3 results) Recent Labs    04/20/20 0619  BNP 33.0    Signed:  Barton Dubois MD.  [facility] 04/20/2020, 12:12 PM

## 2020-04-20 NOTE — Progress Notes (Signed)
Discharge instructions given to patient, patient verbalized understanding of instructions.  Patient discharged home in stable condition.

## 2020-04-21 LAB — QUANTIFERON-TB GOLD PLUS: QuantiFERON-TB Gold Plus: NEGATIVE

## 2020-04-21 LAB — MPO/PR-3 (ANCA) ANTIBODIES
ANCA Proteinase 3: <3.5 U/mL (ref 0.0–3.5)
Myeloperoxidase Abs: <9 U/mL (ref 0.0–9.0)

## 2020-04-21 LAB — QUANTIFERON-TB GOLD PLUS (RQFGPL)
QuantiFERON Mitogen Value: >10 IU/mL
QuantiFERON Nil Value: 0 IU/mL
QuantiFERON TB1 Ag Value: 0.06 IU/mL
QuantiFERON TB2 Ag Value: 0.11 IU/mL

## 2020-04-22 LAB — ANCA TITERS
Atypical P-ANCA titer: <1:20 {titer}
C-ANCA: <1:20 {titer}
P-ANCA: <1:20 {titer}

## 2020-04-22 LAB — GLOMERULAR BASEMENT MEMBRANE ANTIBODIES: GBM Ab: 3 units (ref 0–20)

## 2020-07-16 ENCOUNTER — Other Ambulatory Visit: Payer: Self-pay

## 2020-07-16 ENCOUNTER — Encounter (HOSPITAL_COMMUNITY): Payer: Self-pay | Admitting: Emergency Medicine

## 2020-07-16 ENCOUNTER — Emergency Department (HOSPITAL_COMMUNITY)
Admission: EM | Admit: 2020-07-16 | Discharge: 2020-07-16 | Disposition: A | Discharge status: Home / Self Care | Payer: Self-pay | Attending: Emergency Medicine | Admitting: Emergency Medicine

## 2020-07-16 DIAGNOSIS — F1120 Opioid dependence, uncomplicated: Secondary | ICD-10-CM | POA: Insufficient documentation

## 2020-07-16 DIAGNOSIS — T401X1A Poisoning by heroin, accidental (unintentional), initial encounter: Secondary | ICD-10-CM | POA: Insufficient documentation

## 2020-07-16 DIAGNOSIS — F329 Major depressive disorder, single episode, unspecified: Secondary | ICD-10-CM | POA: Insufficient documentation

## 2020-07-16 DIAGNOSIS — F17210 Nicotine dependence, cigarettes, uncomplicated: Secondary | ICD-10-CM | POA: Insufficient documentation

## 2020-07-16 LAB — RAPID URINE DRUG SCREEN, HOSP PERFORMED
Amphetamines: NOT DETECTED
Barbiturates: NOT DETECTED
Benzodiazepines: NOT DETECTED
Cocaine: POSITIVE — AB
Opiates: NOT DETECTED
Tetrahydrocannabinol: POSITIVE — AB

## 2020-07-16 LAB — COMPREHENSIVE METABOLIC PANEL
ALT: 82 U/L — ABNORMAL HIGH (ref 0–44)
AST: 64 U/L — ABNORMAL HIGH (ref 15–41)
Albumin: 4.1 g/dL (ref 3.5–5.0)
Alkaline Phosphatase: 94 U/L (ref 38–126)
Anion gap: 9 (ref 5–15)
BUN: 16 mg/dL (ref 6–20)
CO2: 24 mmol/L (ref 22–32)
Calcium: 8.8 mg/dL — ABNORMAL LOW (ref 8.9–10.3)
Chloride: 103 mmol/L (ref 98–111)
Creatinine, Ser: 0.89 mg/dL (ref 0.61–1.24)
GFR, Estimated: >60 mL/min (ref >60)
Glucose, Bld: 91 mg/dL (ref 70–99)
Potassium: 3.4 mmol/L — ABNORMAL LOW (ref 3.5–5.1)
Sodium: 136 mmol/L (ref 135–145)
Total Bilirubin: 0.3 mg/dL (ref 0.3–1.2)
Total Protein: 7.1 g/dL (ref 6.5–8.1)

## 2020-07-16 LAB — CBC WITH DIFFERENTIAL/PLATELET
Abs Immature Granulocytes: 0.02 10*3/uL (ref 0.00–0.07)
Basophils Absolute: 0.1 10*3/uL (ref 0.0–0.1)
Basophils Relative: 1 %
Eosinophils Absolute: 0.1 10*3/uL (ref 0.0–0.5)
Eosinophils Relative: 1 %
HCT: 43.2 % (ref 39.0–52.0)
Hemoglobin: 14.8 g/dL (ref 13.0–17.0)
Immature Granulocytes: 0 %
Lymphocytes Relative: 17 %
Lymphs Abs: 1.4 10*3/uL (ref 0.7–4.0)
MCH: 31 pg (ref 26.0–34.0)
MCHC: 34.3 g/dL (ref 30.0–36.0)
MCV: 90.4 fL (ref 80.0–100.0)
Monocytes Absolute: 0.8 10*3/uL (ref 0.1–1.0)
Monocytes Relative: 9 %
Neutro Abs: 6.1 10*3/uL (ref 1.7–7.7)
Neutrophils Relative %: 72 %
Platelets: 181 10*3/uL (ref 150–400)
RBC: 4.78 MIL/uL (ref 4.22–5.81)
RDW: 12.2 % (ref 11.5–15.5)
WBC: 8.5 10*3/uL (ref 4.0–10.5)
nRBC: 0 % (ref 0.0–0.2)

## 2020-07-16 LAB — ETHANOL: Alcohol, Ethyl (B): <10 mg/dL (ref <10)

## 2020-07-16 NOTE — ED Notes (Signed)
Pt is Psych cleared

## 2020-07-16 NOTE — ED Notes (Signed)
Behavioral Health called to assess patient, pt is drowsy and dozing intermittently. Behavioral Health will assess when more alert.

## 2020-07-16 NOTE — ED Notes (Signed)
TTS monitor in room. °
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
## 2020-07-16 NOTE — ED Provider Notes (Signed)
[facility] EMERGENCY DEPARTMENT Provider Note   CSN: [id number] Arrival date & time: 07/16/20  0038     History Chief Complaint  Patient presents with  . Drug Overdose    Anthony Ponce is a 25 y.o. male.  Patient brought to the emergency department after first responders administered Narcan for heroin overdose.  Patient reports snorting heroin tonight and it "went bad".  Patient reports that he does not use heroin often and that is why he probably overdosed tonight.  He reports that he miscalculated his dose, was not trying to harm himself.  He does, however, admit that he does not have much of a support system and feels depressed.        Past Medical History:  Diagnosis Date  . Cirrhosis (HCC)   . Crohn disease (HCC)   . Liver damage    Pt goes to [facility] hospital    Patient Active Problem List   Diagnosis Date Noted  . Crohn's disease with complication (HCC)   . Other cirrhosis of liver (HCC)   . Tobacco abuse   . Marijuana abuse   . Community acquired pneumonia of right upper lobe of lung 04/18/2020  . Pulmonary infiltrates 04/18/2020  . Acute respiratory failure with hypoxia (HCC) 04/18/2020  . Hemoptysis     Past Surgical History:  Procedure Laterality Date  . COLONOSCOPY W/ BIOPSIES    . ESOPHAGOSCOPY         No family history on file.  Social History   Tobacco Use  . Smoking status: Current Every Day Smoker    Packs/day: 0.50    Types: Cigarettes  . Smokeless tobacco: Never Used  Substance Use Topics  . Alcohol use: Yes    Comment: liquor on 09/25/15 unknown amount  . Drug use: No    Home Medications Prior to Admission medications   Medication Sig Start Date End Date Taking? Authorizing Provider  nicotine (NICODERM CQ - DOSED IN MG/24 HOURS) 14 mg/24hr patch Place 1 patch (14 mg total) onto the skin daily. 04/21/20   Vassie Loll, MD  ondansetron (ZOFRAN ODT) 8 MG disintegrating tablet Take 1 tablet (8 mg total) by mouth every 8 (eight)  hours as needed for nausea or vomiting. 04/20/20   Vassie Loll, MD  pantoprazole (PROTONIX) 40 MG tablet Take 1 tablet (40 mg total) by mouth daily. 04/20/20   Vassie Loll, MD    Allergies    Patient has no known allergies.  Review of Systems   Review of Systems  Psychiatric/Behavioral: Positive for dysphoric mood.  All other systems reviewed and are negative.   Physical Exam Updated Vital Signs BP (!) 129/92 (BP Location: Left Arm)   Pulse 89   Temp 97.8 F (36.6 C) (Oral)   Resp 15   Wt 73 kg   SpO2 91%   BMI 23.09 kg/m   Physical Exam Vitals and nursing note reviewed.  Constitutional:      General: He is not in acute distress.    Appearance: Normal appearance. He is well-developed.  HENT:     Head: Normocephalic and atraumatic.     Right Ear: Hearing normal.     Left Ear: Hearing normal.     Nose: Nose normal.  Eyes:     Conjunctiva/sclera: Conjunctivae normal.     Pupils: Pupils are equal, round, and reactive to light.  Cardiovascular:     Rate and Rhythm: Regular rhythm.     Heart sounds: S1 normal and S2 normal. No  murmur heard. No friction rub. No gallop.   Pulmonary:     Effort: Pulmonary effort is normal. No respiratory distress.     Breath sounds: Normal breath sounds.  Chest:     Chest wall: No tenderness.  Abdominal:     General: Bowel sounds are normal.     Palpations: Abdomen is soft.     Tenderness: There is no abdominal tenderness. There is no guarding or rebound. Negative signs include Murphy's sign and McBurney's sign.     Hernia: No hernia is present.  Musculoskeletal:        General: Normal range of motion.     Cervical back: Normal range of motion and neck supple.  Skin:    General: Skin is warm and dry.     Findings: No rash.  Neurological:     Mental Status: He is alert and oriented to person, place, and time.     GCS: GCS eye subscore is 4. GCS verbal subscore is 5. GCS motor subscore is 6.     Cranial Nerves: No cranial nerve  deficit.     Sensory: No sensory deficit.     Coordination: Coordination normal.  Psychiatric:        Mood and Affect: Mood is depressed. Affect is tearful.        Speech: Speech normal.        Behavior: Behavior normal.        Thought Content: Thought content normal.     ED Results / Procedures / Treatments   Labs (all labs ordered are listed, but only abnormal results are displayed) Labs Reviewed - No data to display  EKG None  Radiology No results found.  Procedures Procedures   Medications Ordered in ED Medications - No data to display  ED Course  I have reviewed the triage vital signs and the nursing notes.  Pertinent labs & imaging results that were available during my care of the patient were reviewed by me and considered in my medical decision making (see chart for details).    MDM Rules/Calculators/A&P                         Patient presents after unintentional heroin overdose.  Patient received Narcan and is now awake and alert.  Will monitor.  Patient does endorse depression with decreased support system.  He feels hopeless, helpless enjoying this.  At times he feels like he does not want to live but does not have a suicide plan.  Will consult TTS.  Final Clinical Impression(s) / ED Diagnoses Final diagnoses:  Accidental overdose of heroin, initial encounter [facility])    Rx / DC Orders ED Discharge Orders    None       Sierria Bruney, Canary Brim, MD 07/16/20 [phone]

## 2020-07-16 NOTE — ED Notes (Signed)
TTS at this time. 

## 2020-07-16 NOTE — ED Triage Notes (Addendum)
Police administered 8g of narcan from heroin overdose.  Per ems pt has been vomiting since narcan was administered.

## 2020-07-16 NOTE — ED Provider Notes (Signed)
  Physical Exam  BP 109/70   Pulse (!) 56   Temp 97.8 F (36.6 C) (Oral)   Resp 13   Wt 73 kg   SpO2 97%   BMI 23.09 kg/m   Physical Exam  ED Course/Procedures     Procedures  MDM  Patient has been seen by psychiatry and cleared for discharge.       Benjiman Core, MD 07/16/20 [phone]

## 2020-07-16 NOTE — ED Notes (Signed)
Pt was given a cup of water.  °

## 2020-07-16 NOTE — BH Assessment (Signed)
Per Herbert Seta, RN pt cannot fully engage in TTS assessment. Clinician asked RN to call once pt is alert and able to engage in assessment.    Redmond Pulling, MS, [facility], [facility] Triage Specialist [phone]

## 2020-11-20 ENCOUNTER — Emergency Department (HOSPITAL_COMMUNITY): Payer: Self-pay

## 2020-11-20 ENCOUNTER — Other Ambulatory Visit: Payer: Self-pay

## 2020-11-20 ENCOUNTER — Observation Stay (HOSPITAL_COMMUNITY): Payer: Self-pay

## 2020-11-20 ENCOUNTER — Encounter (HOSPITAL_COMMUNITY): Payer: Self-pay

## 2020-11-20 ENCOUNTER — Inpatient Hospital Stay (HOSPITAL_COMMUNITY)
Admission: EM | Admit: 2020-11-20 | Discharge: 2020-11-21 | DRG: 917 | Disposition: A | Discharge status: Home / Self Care | Payer: Self-pay | Attending: Internal Medicine | Admitting: Internal Medicine

## 2020-11-20 DIAGNOSIS — K50919 Crohn's disease, unspecified, with unspecified complications: Secondary | ICD-10-CM | POA: Diagnosis present

## 2020-11-20 DIAGNOSIS — K7469 Other cirrhosis of liver: Secondary | ICD-10-CM | POA: Diagnosis present

## 2020-11-20 DIAGNOSIS — Z8719 Personal history of other diseases of the digestive system: Secondary | ICD-10-CM

## 2020-11-20 DIAGNOSIS — D72829 Elevated white blood cell count, unspecified: Secondary | ICD-10-CM

## 2020-11-20 DIAGNOSIS — R739 Hyperglycemia, unspecified: Secondary | ICD-10-CM

## 2020-11-20 DIAGNOSIS — J9601 Acute respiratory failure with hypoxia: Secondary | ICD-10-CM | POA: Diagnosis present

## 2020-11-20 DIAGNOSIS — R7401 Elevation of levels of liver transaminase levels: Secondary | ICD-10-CM

## 2020-11-20 DIAGNOSIS — F1110 Opioid abuse, uncomplicated: Secondary | ICD-10-CM | POA: Diagnosis present

## 2020-11-20 DIAGNOSIS — J690 Pneumonitis due to inhalation of food and vomit: Secondary | ICD-10-CM | POA: Diagnosis present

## 2020-11-20 DIAGNOSIS — T401X1A Poisoning by heroin, accidental (unintentional), initial encounter: Principal | ICD-10-CM

## 2020-11-20 DIAGNOSIS — Z720 Tobacco use: Secondary | ICD-10-CM | POA: Diagnosis present

## 2020-11-20 DIAGNOSIS — E162 Hypoglycemia, unspecified: Secondary | ICD-10-CM | POA: Diagnosis present

## 2020-11-20 DIAGNOSIS — Z20822 Contact with and (suspected) exposure to covid-19: Secondary | ICD-10-CM | POA: Diagnosis present

## 2020-11-20 DIAGNOSIS — F17210 Nicotine dependence, cigarettes, uncomplicated: Secondary | ICD-10-CM | POA: Diagnosis present

## 2020-11-20 DIAGNOSIS — E872 Acidosis, unspecified: Secondary | ICD-10-CM

## 2020-11-20 DIAGNOSIS — I4891 Unspecified atrial fibrillation: Secondary | ICD-10-CM | POA: Diagnosis present

## 2020-11-20 DIAGNOSIS — T401X4A Poisoning by heroin, undetermined, initial encounter: Secondary | ICD-10-CM

## 2020-11-20 DIAGNOSIS — R7989 Other specified abnormal findings of blood chemistry: Secondary | ICD-10-CM

## 2020-11-20 DIAGNOSIS — R0902 Hypoxemia: Secondary | ICD-10-CM | POA: Diagnosis present

## 2020-11-20 DIAGNOSIS — E860 Dehydration: Secondary | ICD-10-CM

## 2020-11-20 LAB — URINALYSIS, ROUTINE W REFLEX MICROSCOPIC
Bilirubin Urine: NEGATIVE
Glucose, UA: 150 mg/dL — AB
Hgb urine dipstick: NEGATIVE
Ketones, ur: NEGATIVE mg/dL
Leukocytes,Ua: NEGATIVE
Nitrite: NEGATIVE
Protein, ur: NEGATIVE mg/dL
Specific Gravity, Urine: 1.033 — ABNORMAL HIGH (ref 1.005–1.030)
pH: 6 (ref 5.0–8.0)

## 2020-11-20 LAB — CBC WITH DIFFERENTIAL/PLATELET
Abs Immature Granulocytes: 0.15 10*3/uL — ABNORMAL HIGH (ref 0.00–0.07)
Basophils Absolute: 0.1 10*3/uL (ref 0.0–0.1)
Basophils Relative: 0 %
Eosinophils Absolute: 0 10*3/uL (ref 0.0–0.5)
Eosinophils Relative: 0 %
HCT: 45.1 % (ref 39.0–52.0)
Hemoglobin: 15.8 g/dL (ref 13.0–17.0)
Immature Granulocytes: 1 %
Lymphocytes Relative: 5 %
Lymphs Abs: 1 10*3/uL (ref 0.7–4.0)
MCH: 31.6 pg (ref 26.0–34.0)
MCHC: 35 g/dL (ref 30.0–36.0)
MCV: 90.2 fL (ref 80.0–100.0)
Monocytes Absolute: 0.7 10*3/uL (ref 0.1–1.0)
Monocytes Relative: 3 %
Neutro Abs: 17.4 10*3/uL — ABNORMAL HIGH (ref 1.7–7.7)
Neutrophils Relative %: 91 %
Platelets: 205 10*3/uL (ref 150–400)
RBC: 5 MIL/uL (ref 4.22–5.81)
RDW: 12.6 % (ref 11.5–15.5)
WBC: 19.2 10*3/uL — ABNORMAL HIGH (ref 4.0–10.5)
nRBC: 0 % (ref 0.0–0.2)

## 2020-11-20 LAB — ETHANOL: Alcohol, Ethyl (B): <10 mg/dL (ref <10)

## 2020-11-20 LAB — BASIC METABOLIC PANEL
Anion gap: 3 — ABNORMAL LOW (ref 5–15)
BUN: 14 mg/dL (ref 6–20)
CO2: 27 mmol/L (ref 22–32)
Calcium: 7.9 mg/dL — ABNORMAL LOW (ref 8.9–10.3)
Chloride: 108 mmol/L (ref 98–111)
Creatinine, Ser: 0.9 mg/dL (ref 0.61–1.24)
GFR, Estimated: >60 mL/min (ref >60)
Glucose, Bld: 55 mg/dL — ABNORMAL LOW (ref 70–99)
Potassium: 4 mmol/L (ref 3.5–5.1)
Sodium: 138 mmol/L (ref 135–145)

## 2020-11-20 LAB — COMPREHENSIVE METABOLIC PANEL
ALT: 105 U/L — ABNORMAL HIGH (ref 0–44)
AST: 66 U/L — ABNORMAL HIGH (ref 15–41)
Albumin: 4.1 g/dL (ref 3.5–5.0)
Alkaline Phosphatase: 102 U/L (ref 38–126)
Anion gap: 11 (ref 5–15)
BUN: 16 mg/dL (ref 6–20)
CO2: 20 mmol/L — ABNORMAL LOW (ref 22–32)
Calcium: 8.2 mg/dL — ABNORMAL LOW (ref 8.9–10.3)
Chloride: 105 mmol/L (ref 98–111)
Creatinine, Ser: 1.19 mg/dL (ref 0.61–1.24)
GFR, Estimated: >60 mL/min (ref >60)
Glucose, Bld: 275 mg/dL — ABNORMAL HIGH (ref 70–99)
Potassium: 4.1 mmol/L (ref 3.5–5.1)
Sodium: 136 mmol/L (ref 135–145)
Total Bilirubin: 0.7 mg/dL (ref 0.3–1.2)
Total Protein: 7.4 g/dL (ref 6.5–8.1)

## 2020-11-20 LAB — RESP PANEL BY RT-PCR (FLU A&B, COVID) ARPGX2
Influenza A by PCR: NEGATIVE
Influenza B by PCR: NEGATIVE
SARS Coronavirus 2 by RT PCR: NEGATIVE

## 2020-11-20 LAB — BLOOD GAS, VENOUS
Acid-base deficit: 0.2 mmol/L (ref 0.0–2.0)
Bicarbonate: 24.3 mmol/L (ref 20.0–28.0)
FIO2: 21
O2 Saturation: 96.5 %
Patient temperature: 36.7
pCO2, Ven: 38.3 mmHg — ABNORMAL LOW (ref 44.0–60.0)
pH, Ven: 7.41 (ref 7.250–7.430)
pO2, Ven: 88.2 mmHg — ABNORMAL HIGH (ref 32.0–45.0)

## 2020-11-20 LAB — D-DIMER, QUANTITATIVE: D-Dimer, Quant: 2.03 ug/mL-FEU — ABNORMAL HIGH (ref 0.00–0.50)

## 2020-11-20 LAB — CBG MONITORING, ED
Glucose-Capillary: 362 mg/dL — ABNORMAL HIGH (ref 70–99)
Glucose-Capillary: 59 mg/dL — ABNORMAL LOW (ref 70–99)
Glucose-Capillary: 95 mg/dL (ref 70–99)

## 2020-11-20 LAB — HEMOGLOBIN A1C
Hgb A1c MFr Bld: 5.1 % (ref 4.8–5.6)
Mean Plasma Glucose: 99.67 mg/dL

## 2020-11-20 LAB — SALICYLATE LEVEL: Salicylate Lvl: <7 mg/dL — ABNORMAL LOW (ref 7.0–30.0)

## 2020-11-20 LAB — LACTIC ACID, PLASMA
Lactic Acid, Venous: 2.2 mmol/L (ref 0.5–1.9)
Lactic Acid, Venous: 0.6 mmol/L (ref 0.5–1.9)
Lactic Acid, Venous: 0.6 mmol/L (ref 0.5–1.9)
Lactic Acid, Venous: 0.7 mmol/L (ref 0.5–1.9)

## 2020-11-20 LAB — RAPID URINE DRUG SCREEN, HOSP PERFORMED
Amphetamines: NOT DETECTED
Barbiturates: NOT DETECTED
Benzodiazepines: NOT DETECTED
Cocaine: NOT DETECTED
Opiates: NOT DETECTED
Tetrahydrocannabinol: POSITIVE — AB

## 2020-11-20 LAB — ACETAMINOPHEN LEVEL: Acetaminophen (Tylenol), Serum: <10 ug/mL — ABNORMAL LOW (ref 10–30)

## 2020-11-20 LAB — BETA-HYDROXYBUTYRIC ACID: Beta-Hydroxybutyric Acid: 0.1 mmol/L (ref 0.05–0.27)

## 2020-11-20 MED ORDER — SODIUM CHLORIDE 0.9 % IV BOLUS
1000.0000 mL | Freq: Once | INTRAVENOUS | Status: AC
  Administered 2020-11-20: 1000 mL via INTRAVENOUS

## 2020-11-20 MED ORDER — SODIUM CHLORIDE 0.9 % IV SOLN
12.5000 mg | Freq: Four times a day (QID) | INTRAVENOUS | Status: DC | PRN
  Administered 2020-11-20: 12.5 mg via INTRAVENOUS
  Filled 2020-11-20: qty 0.5

## 2020-11-20 MED ORDER — SODIUM CHLORIDE 0.9 % IV SOLN: Freq: Once | INTRAVENOUS | Status: AC

## 2020-11-20 MED ORDER — DILTIAZEM HCL-DEXTROSE 125-5 MG/125ML-% IV SOLN (PREMIX)
5.0000 mg/h | INTRAVENOUS | Status: DC
  Administered 2020-11-20: 5 mg/h via INTRAVENOUS
  Filled 2020-11-20: qty 125

## 2020-11-20 MED ORDER — IOHEXOL 350 MG/ML SOLN
100.0000 mL | Freq: Once | INTRAVENOUS | Status: AC | PRN
  Administered 2020-11-20: 100 mL via INTRAVENOUS

## 2020-11-20 MED ORDER — ONDANSETRON HCL 4 MG/2ML IJ SOLN
4.0000 mg | Freq: Once | INTRAMUSCULAR | Status: AC
  Administered 2020-11-20: 4 mg via INTRAVENOUS
  Filled 2020-11-20: qty 2

## 2020-11-20 MED ORDER — DEXTROSE 50 % IV SOLN
12.5000 g | Freq: Once | INTRAVENOUS | Status: AC
  Administered 2020-11-20: 12.5 g via INTRAVENOUS
  Filled 2020-11-20: qty 50

## 2020-11-20 MED ORDER — ENOXAPARIN SODIUM 40 MG/0.4ML IJ SOSY
40.0000 mg | PREFILLED_SYRINGE | INTRAMUSCULAR | Status: DC
  Filled 2020-11-20: qty 0.4

## 2020-11-20 MED ORDER — PROMETHAZINE HCL 25 MG/ML IJ SOLN
INTRAMUSCULAR | Status: AC
  Filled 2020-11-20: qty 1

## 2020-11-20 MED ORDER — ONDANSETRON HCL 4 MG/2ML IJ SOLN: 4.0000 mg | Freq: Four times a day (QID) | INTRAMUSCULAR | Status: DC | PRN

## 2020-11-20 MED ORDER — INSULIN ASPART 100 UNIT/ML IJ SOLN
5.0000 [IU] | Freq: Once | INTRAMUSCULAR | Status: AC
  Administered 2020-11-20: 5 [IU] via SUBCUTANEOUS
  Filled 2020-11-20: qty 1

## 2020-11-20 MED ORDER — ACETAMINOPHEN 325 MG PO TABS: 650.0000 mg | ORAL_TABLET | Freq: Four times a day (QID) | ORAL | Status: DC | PRN

## 2020-11-20 MED ORDER — DILTIAZEM LOAD VIA INFUSION
10.0000 mg | Freq: Once | INTRAVENOUS | Status: AC
  Administered 2020-11-20: 10 mg via INTRAVENOUS
  Filled 2020-11-20: qty 10

## 2020-11-20 MED ORDER — AMOXICILLIN-POT CLAVULANATE 875-125 MG PO TABS
1.0000 | ORAL_TABLET | Freq: Two times a day (BID) | ORAL | Status: DC
  Administered 2020-11-20 – 2020-11-21 (×3): 1 via ORAL
  Filled 2020-11-20 (×3): qty 1

## 2020-11-20 NOTE — ED Provider Notes (Signed)
[facility] EMERGENCY DEPARTMENT Provider Note   CSN: [id number] Arrival date & time: 11/20/20  0002     History Chief Complaint  Patient presents with   Drug Overdose    Anthony Ponce is a 25 y.o. male.  Level 5 caveat for altered mental status.  Patient brought in by EMS after apparent heroin overdose.  He admits to snorting heroin and was apparently unresponsive on EMS arrival.  He received 7 mg of Narcan and is now awake.  Denies any suicidal attempt.  He is complaining of being thirsty and asking for water.  He is oriented to person and place.  He states he feels short of breath and is very thirsty.  He reports a medical history of cirrhosis and Crohn's disease with takes no regular medications.  Denies any attempted suicide. Denies any other ingestions.  Arrives in A. fib with RVR with no history of same. Hyperglycemia 362. No history of diabetes.  The history is provided by the patient and the EMS personnel.  Drug Overdose Pertinent negatives include no chest pain, no headaches and no shortness of breath.      Past Medical History:  Diagnosis Date   Cirrhosis (HCC)    Crohn disease (HCC)    Liver damage    Pt goes to [facility] hospital    Patient Active Problem List   Diagnosis Date Noted   Crohn's disease with complication [facility])    Other cirrhosis of liver (HCC)    Tobacco abuse    Marijuana abuse    Community acquired pneumonia of right upper lobe of lung 04/18/2020   Pulmonary infiltrates 04/18/2020   Acute respiratory failure with hypoxia (HCC) 04/18/2020   Hemoptysis     Past Surgical History:  Procedure Laterality Date   COLONOSCOPY W/ BIOPSIES     ESOPHAGOSCOPY         No family history on file.  Social History   Tobacco Use   Smoking status: Every Day    Packs/day: 0.50    Types: Cigarettes   Smokeless tobacco: Never  Substance Use Topics   Alcohol use: Yes    Comment: liquor on 09/25/15 unknown amount   Drug use: No    Home  Medications Prior to Admission medications   Medication Sig Start Date End Date Taking? Authorizing Provider  nicotine (NICODERM CQ - DOSED IN MG/24 HOURS) 14 mg/24hr patch Place 1 patch (14 mg total) onto the skin daily. Patient not taking: Reported on 07/16/2020 04/21/20   Vassie Loll, MD  ondansetron (ZOFRAN ODT) 8 MG disintegrating tablet Take 1 tablet (8 mg total) by mouth every 8 (eight) hours as needed for nausea or vomiting. Patient not taking: Reported on 07/16/2020 04/20/20   Vassie Loll, MD  pantoprazole (PROTONIX) 40 MG tablet Take 1 tablet (40 mg total) by mouth daily. Patient not taking: Reported on 07/16/2020 04/20/20   Vassie Loll, MD    Allergies    Patient has no known allergies.  Review of Systems   Review of Systems  Constitutional:  Positive for fatigue. Negative for activity change, appetite change and fever.  HENT:  Negative for congestion and rhinorrhea.   Respiratory:  Negative for cough, chest tightness and shortness of breath.   Cardiovascular:  Negative for chest pain.  Gastrointestinal:  Negative for nausea and vomiting.  Genitourinary:  Negative for dysuria and hematuria.  Musculoskeletal:  Negative for arthralgias and myalgias.  Skin:  Negative for rash.  Neurological:  Positive for weakness. Negative for  dizziness and headaches.   all other systems are negative except as noted in the HPI and PMH.   Physical Exam Updated Vital Signs BP 93/78   Pulse 73   Temp 98.1 F (36.7 C) (Oral)   Resp (!) 23   Ht 5\' 10"  (1.778 m)   Wt 75 kg   SpO2 99%   BMI 23.72 kg/m   Physical Exam Vitals and nursing note reviewed.  Constitutional:      General: He is not in acute distress.    Appearance: He is well-developed.     Comments: Disheveled  HENT:     Head: Normocephalic and atraumatic.     Mouth/Throat:     Pharynx: No oropharyngeal exudate.  Eyes:     Conjunctiva/sclera: Conjunctivae normal.     Pupils: Pupils are equal, round, and reactive to  light.  Neck:     Comments: No meningismus. Cardiovascular:     Rate and Rhythm: Tachycardia present. Rhythm irregular.     Heart sounds: Normal heart sounds. No murmur heard.    Comments: Irregular tachycardia 120s to 140s Pulmonary:     Effort: Pulmonary effort is normal. No respiratory distress.     Breath sounds: Normal breath sounds.  Chest:     Chest wall: No tenderness.  Abdominal:     Palpations: Abdomen is soft.     Tenderness: There is no abdominal tenderness. There is no guarding or rebound.  Musculoskeletal:        General: No tenderness. Normal range of motion.     Cervical back: Normal range of motion and neck supple.  Skin:    General: Skin is warm.  Neurological:     Mental Status: He is alert and oriented to person, place, and time.     Cranial Nerves: No cranial nerve deficit.     Motor: No abnormal muscle tone.     Coordination: Coordination normal.     Comments:  5/5 strength throughout. CN 2-12 intact.Equal grip strength.   Psychiatric:        Behavior: Behavior normal.    ED Results / Procedures / Treatments   Labs (all labs ordered are listed, but only abnormal results are displayed) Labs Reviewed  CBC WITH DIFFERENTIAL/PLATELET - Abnormal; Notable for the following components:      Result Value   WBC 19.2 (*)    Neutro Abs 17.4 (*)    Abs Immature Granulocytes 0.15 (*)    All other components within normal limits  COMPREHENSIVE METABOLIC PANEL - Abnormal; Notable for the following components:   CO2 20 (*)    Glucose, Bld 275 (*)    Calcium 8.2 (*)    AST 66 (*)    ALT 105 (*)    All other components within normal limits  ACETAMINOPHEN LEVEL - Abnormal; Notable for the following components:   Acetaminophen (Tylenol), Serum <10 (*)    All other components within normal limits  SALICYLATE LEVEL - Abnormal; Notable for the following components:   Salicylate Lvl <7.0 (*)    All other components within normal limits  LACTIC ACID, PLASMA -  Abnormal; Notable for the following components:   Lactic Acid, Venous 2.2 (*)    All other components within normal limits  BLOOD GAS, VENOUS - Abnormal; Notable for the following components:   pCO2, Ven 38.3 (*)    pO2, Ven 88.2 (*)    All other components within normal limits  BASIC METABOLIC PANEL - Abnormal; Notable for the following  components:   Glucose, Bld 55 (*)    Calcium 7.9 (*)    Anion gap 3 (*)    All other components within normal limits  CBG MONITORING, ED - Abnormal; Notable for the following components:   Glucose-Capillary 362 (*)    All other components within normal limits  CBG MONITORING, ED - Abnormal; Notable for the following components:   Glucose-Capillary 59 (*)    All other components within normal limits  CULTURE, BLOOD (ROUTINE X 2)  CULTURE, BLOOD (ROUTINE X 2)  RESP PANEL BY RT-PCR (FLU A&B, COVID) ARPGX2  ETHANOL  LACTIC ACID, PLASMA  BETA-HYDROXYBUTYRIC ACID  RAPID URINE DRUG SCREEN, HOSP PERFORMED  URINALYSIS, ROUTINE W REFLEX MICROSCOPIC  D-DIMER, QUANTITATIVE  CBG MONITORING, ED    EKG EKG Interpretation  Date/Time:  Wednesday November 20 2020 00:28:23 EDT Ventricular Rate:  158 PR Interval:    QRS Duration: 93 QT Interval:  296 QTC Calculation: 489 R Axis:   88 Text Interpretation: Atrial fibrillation Borderline repolarization abnormality Prolonged QT interval Baseline wander in lead(s) V2 V3 new atrial fibrillation with rvr Confirmed by Glynn Octave [phone]) on 11/20/2020 12:31:28 AM  Radiology DG Chest Portable 1 View  Result Date: 11/20/2020 CLINICAL DATA:  Drug overdose, Crohn's disease, unresponsive EXAM: PORTABLE CHEST 1 VIEW COMPARISON:  04/19/2020 FINDINGS: The heart size and mediastinal contours are within normal limits. Both lungs are clear. The visualized skeletal structures are unremarkable. IMPRESSION: No active disease. Electronically Signed   By: Helyn Numbers MD   On: 11/20/2020 00:55    Procedures .Critical  Care  Date/Time: 11/20/2020 4:55 AM Performed by: Glynn Octave, MD Authorized by: Glynn Octave, MD   Critical care provider statement:    Critical care time (minutes):  45   Critical care was necessary to treat or prevent imminent or life-threatening deterioration of the following conditions:  Respiratory failure, toxidrome and endocrine crisis   Critical care was time spent personally by me on the following activities:  Discussions with consultants, evaluation of patient's response to treatment, examination of patient, ordering and performing treatments and interventions, ordering and review of laboratory studies, ordering and review of radiographic studies, pulse oximetry, re-evaluation of patient's condition, obtaining history from patient or surrogate and review of old charts   Medications Ordered in ED Medications  sodium chloride 0.9 % bolus 1,000 mL (has no administration in time range)  ondansetron (ZOFRAN) injection 4 mg (has no administration in time range)    ED Course  I have reviewed the triage vital signs and the nursing notes.  Pertinent labs & imaging results that were available during my care of the patient were reviewed by me and considered in my medical decision making (see chart for details).    MDM Rules/Calculators/A&P                         {Remember to document critical care time when appropriate:1 Heroin overdose who received Narcan.  He is now awake and alert.  Has atrial fibrillation with RVR is also hyperglycemic to 362 with no history of diabetes  Patient arrives in A. fib with RVR with no history of the same.  He is given IV fluids and Cardizem. No fever.  Labs show leukocytosis of 19.  Hyperglycemia with normal anion gap.  No history of diabetes.  Chest x-ray is negative.  No infiltrate.  Patient did become hypoxic when he goes to sleep but arouses.  Additional Narcan was not given.  Informed of his elevated blood sugar and likely diagnosis of  diabetes. He did convert back to sinus rhythm.  CHA2DS2-VASc score 0. Not anticoagulation candidate. This patients CHA2DS2-VASc Score and unadjusted Ischemic Stroke Rate (% per year) is equal to 0.2 % stroke rate/year from a score of 0  Above score calculated as 1 point each if present [CHF, HTN, DM, Vascular=MI/PAD/Aortic Plaque, Age if 65-74, or Male] Above score calculated as 2 points each if present [Age > 75, or Stroke/TIA/TE]   Patient remains in sinus rhythm.  After IV fluids and insulin, he became hypoglycemic to 59 and was given D50.  Complains of ongoing shortness of breath and chest pain.  Does continue to have an oxygen requirement and drops to 87% on room air.  His chest x-ray is clear.  Will need admission for continued supportive care and oxygen support. Add D-dimer and COVID swab.  Admission d/w Dr. Thomes DinningAdefeso Final Clinical Impression(s) / ED Diagnoses Final diagnoses:  Heroin overdose, undetermined intent, initial encounter (HCC)  Acute respiratory failure with hypoxia (HCC)  Hyperglycemia  Atrial fibrillation with rapid ventricular response [facility](HCC)    Rx / DC Orders ED Discharge Orders     None        Zian Delair, Jeannett SeniorStephen, MD 11/20/20 [phone]

## 2020-11-20 NOTE — TOC Progression Note (Signed)
Transition of Care [facility]) - Progression Note    Patient Details  Name: Anthony Ponce MRN: [id number] Date of Birth: November 30, 1995  Transition of Care [facility]) CM/SW Contact  Karn Cassis, [place] Phone Number: 11/20/2020, 10:45 AM  Clinical Narrative:  LCSW noted pt does not have insurance or PCP. Discussed referral to [facility] for PCP and financial counselor to screen for Medicaid. Pt agreeable. Referrals made.        Barriers to Discharge: Continued Medical Work up  Expected Discharge Plan and Services                                                 Social Determinants of Health (SDOH) Interventions    Readmission Risk Interventions No flowsheet data found.

## 2020-11-20 NOTE — ED Triage Notes (Addendum)
Pt BIB [facility], pt found unresponsive by EMS, total of 7 of narcan given. Pt alert in triage, states he took heroin.

## 2020-11-20 NOTE — Progress Notes (Addendum)
[facility] PROGRESS NOTE   Anthony Ponce TIW:[id number] DOB: [birth year] DOA: 11/20/2020  PCP: Patient, No Pcp Per (Inactive)  Brief History/Interval Summary: 25 y.o. male with medical history significant for cirrhosis, Crohn's disease, tobacco use, drug abuse who presented to the emergency department via EMS due to drug overdose.  Patient states that he was snorting heroine and then tried to rest.  Apparently patient was unresponsive when EMS arrived.  He was given Narcan and then he woke up.  Patient also was noted to be in atrial fibrillation with RVR in the emergency department.  He was also noted to be hypoxic in the emergency department.  He was hospitalized for further management.    Consultants: None  Procedures: None  Antibiotics: Anti-infectives (From admission, onward)    None       Subjective/Interval History: Patient complains of discomfort in his chest area ongoing since last night.  Also complains of cough with some shortness of breath.  Denies any headaches.  No nausea or vomiting.     Assessment/Plan:  Heroin overdose Patient was obtunded when evaluated by EMS.  He was given Narcan with improvement in his mentation.  Patient has been counseled regarding his drug use.  Continue to monitor for now.  Hypoxia/aspiration pneumonia Chest x-ray did not show any evidence for infection.  He was noted to have elevated D-dimer.  CT angiogram was done which did not show any PE however does raise concern for airspace opacities.  Its possible patient may have aspirated.  His COVID-19 test was negative.  He does have elevated WBC.  We will place him on Augmentin.  Hyperglycemia Presented with significantly elevated glucose levels but it was a nonfasting level.  Subsequently noted to be hypoglycemic.  HbA1c is pending. No known history of diabetes.  Lactic acidosis Probably multifactorial.  Subsequently normal.  Transaminitis Mildly elevated AST and ALT noted.   Continue to trend.  Check hepatitis panel. Old labs reviewed.  Has had elevated AST ALT previously as well.  Did a right upper quadrant ultrasound.  History of Crohn's disease Followed at [facility] in [place].  Does not appear to be an active issue currently.    DVT Prophylaxis: Lovenox Code Status: Full code Family Communication: Discussed with the patient Disposition Plan: Hopefully home in 24 to 48 hours  Status is: Observation  The patient remains OBS appropriate and will d/c before 2 midnights.  Dispo: The patient is from: Home              Anticipated d/c is to: Home              Patient currently is not medically stable to d/c.   Difficult to place patient No        Medications: Scheduled:  enoxaparin (LOVENOX) injection  40 mg Subcutaneous Q24H   Continuous:  promethazine (PHENERGAN) injection (IM or IVPB) Stopped (11/20/20 0208)   SNK:NLZJQBHALPFX (PHENERGAN) injection (IM or IVPB)   Objective:  Vital Signs  Vitals:   11/20/20 0830 11/20/20 0900 11/20/20 0930 11/20/20 1000  BP: 102/68 112/70 112/74 123/71  Pulse: 62 63 (!) 55 (!) 55  Resp: 12 11 12 10   Temp:      TempSrc:      SpO2: 99% 98% 98% 95%  Weight:      Height:        Intake/Output Summary (Last 24 hours) at 11/20/2020 1251 Last data filed at 11/20/2020 0139 Gross per 24 hour  Intake 2000 ml  Output --  Net 2000 ml   Filed Weights   11/20/20 0007  Weight: 75 kg    General appearance: Awake alert.  In no distress Resp: Normal effort at rest.  Coarse breath sounds bilaterally.  No wheezing or rhonchi. Cardio: S1-S2 is normal regular.  No S3-S4.  No rubs murmurs or bruit GI: Abdomen is soft.  Nontender nondistended.  Bowel sounds are present normal.  No masses organomegaly Extremities: No edema.  Full range of motion of lower extremities. Neurologic: Alert and oriented x3.  No focal neurological deficits.    Lab Results:  Data Reviewed: I have personally reviewed  following labs and imaging studies  CBC: Recent Labs  Lab 11/20/20 0039  WBC 19.2*  NEUTROABS 17.4*  HGB 15.8  HCT 45.1  MCV 90.2  PLT 205    Basic Metabolic Panel: Recent Labs  Lab 11/20/20 0039 11/20/20 0329  NA 136 138  K 4.1 4.0  CL 105 108  CO2 20* 27  GLUCOSE 275* 55*  BUN 16 14  CREATININE 1.19 0.90  CALCIUM 8.2* 7.9*    GFR: Estimated Creatinine Clearance: 130.7 mL/min (by C-G formula based on SCr of 0.9 mg/dL).  Liver Function Tests: Recent Labs  Lab 11/20/20 0039  AST 66*  ALT 105*  ALKPHOS 102  BILITOT 0.7  PROT 7.4  ALBUMIN 4.1     CBG: Recent Labs  Lab 11/20/20 0007 11/20/20 0411 11/20/20 0509  GLUCAP 362* 59* 95      Recent Results (from the past 240 hour(s))  Blood culture (routine x 2)     Status: None (Preliminary result)   Collection Time: 11/20/20  1:15 AM   Specimen: BLOOD LEFT HAND  Result Value Ref Range Status   Specimen Description BLOOD LEFT HAND  Final   Special Requests   Final    BOTTLES DRAWN AEROBIC AND ANAEROBIC Blood Culture adequate volume   Culture   Final    NO GROWTH < 12 HOURS Performed at [facility], [address] [place], [place] [address]    Report Status PENDING  Incomplete  Blood culture (routine x 2)     Status: None (Preliminary result)   Collection Time: 11/20/20  3:29 AM   Specimen: BLOOD LEFT HAND  Result Value Ref Range Status   Specimen Description BLOOD LEFT HAND  Final   Special Requests   Final    BOTTLES DRAWN AEROBIC AND ANAEROBIC Blood Culture adequate volume   Culture   Final    NO GROWTH < 12 HOURS Performed at [facility], [address], [place], [place] [address]    Report Status PENDING  Incomplete  Resp Panel by RT-PCR (Flu A&B, Covid) Nasopharyngeal Swab     Status: None   Collection Time: 11/20/20  4:55 AM   Specimen: Nasopharyngeal Swab; Nasopharyngeal(NP) swabs in vial transport medium  Result Value Ref Range Status   SARS Coronavirus 2 by RT PCR NEGATIVE NEGATIVE  Final    Comment: (NOTE) SARS-CoV-2 target nucleic acids are NOT DETECTED.  The SARS-CoV-2 RNA is generally detectable in upper respiratory specimens during the acute phase of infection. The lowest concentration of SARS-CoV-2 viral copies this assay can detect is 138 copies/mL. A negative result does not preclude SARS-Cov-2 infection and should not be used as the sole basis for treatment or other patient management decisions. A negative result may occur with  improper specimen collection/handling, submission of specimen other than nasopharyngeal swab, presence of viral mutation(s) within the areas targeted by  this assay, and inadequate number of viral copies(<138 copies/mL). A negative result must be combined with clinical observations, patient history, and epidemiological information. The expected result is Negative.  Fact Sheet for Patients:  BloggerCourse.com  Fact Sheet for Healthcare Providers:  SeriousBroker.it  This test is no t yet approved or cleared by the [place] FDA and  has been authorized for detection and/or diagnosis of SARS-CoV-2 by FDA under an Emergency Use Authorization (EUA). This EUA will remain  in effect (meaning this test can be used) for the duration of the COVID-19 declaration under Section 564(b)(1) of the Act, 21 U.S.C.section 360bbb-3(b)(1), unless the authorization is terminated  or revoked sooner.       Influenza A by PCR NEGATIVE NEGATIVE Final   Influenza B by PCR NEGATIVE NEGATIVE Final    Comment: (NOTE) The Xpert Xpress SARS-CoV-2/FLU/RSV plus assay is intended as an aid in the diagnosis of influenza from Nasopharyngeal swab specimens and should not be used as a sole basis for treatment. Nasal washings and aspirates are unacceptable for Xpert Xpress SARS-CoV-2/FLU/RSV testing.  Fact Sheet for Patients: BloggerCourse.com  Fact Sheet for Healthcare  Providers: SeriousBroker.it  This test is not yet approved or cleared by the [place] FDA and has been authorized for detection and/or diagnosis of SARS-CoV-2 by FDA under an Emergency Use Authorization (EUA). This EUA will remain in effect (meaning this test can be used) for the duration of the COVID-19 declaration under Section 564(b)(1) of the Act, 21 U.S.C. section 360bbb-3(b)(1), unless the authorization is terminated or revoked.  Performed at [facility], [address] [place], [place] [address]       Radiology Studies: CT Angio Chest Pulmonary Embolism (PE) W or WO Contrast  Result Date: 11/20/2020 CLINICAL DATA:  PE suspected, low/intermediate prob, positive D-dimer EXAM: CT ANGIOGRAPHY CHEST WITH CONTRAST TECHNIQUE: Multidetector CT imaging of the chest was performed using the standard protocol during bolus administration of intravenous contrast. Multiplanar CT image reconstructions and MIPs were obtained to evaluate the vascular anatomy. CONTRAST:  OMNIPAQUE IOHEXOL 350 MG/ML SOLN COMPARISON:  04/18/2020 FINDINGS: Cardiovascular: No filling defects in the pulmonary arteries to suggest pulmonary emboli. Heart is normal size. Aorta normal caliber. Mediastinum/Nodes: No mediastinal, hilar, or axillary adenopathy. Trachea and esophagus are unremarkable. Thyroid unremarkable. Lungs/Pleura: Airspace disease in the lower lobes, left greater than right as well as posterior left upper lobe. Findings could reflect early pneumonia. No effusions. Upper Abdomen: Imaging into the upper abdomen demonstrates no acute findings. Musculoskeletal: Chest wall soft tissues are unremarkable. No acute bony abnormality. Review of the MIP images confirms the above findings. IMPRESSION: No evidence of pulmonary embolus. Airspace disease in the left lower lobe, right base and posterior left upper lobe concerning for early pneumonia. Electronically Signed   By: Charlett Nose M.D.    On: 11/20/2020 09:12   DG Chest Portable 1 View  Result Date: 11/20/2020 CLINICAL DATA:  Drug overdose, Crohn's disease, unresponsive EXAM: PORTABLE CHEST 1 VIEW COMPARISON:  04/19/2020 FINDINGS: The heart size and mediastinal contours are within normal limits. Both lungs are clear. The visualized skeletal structures are unremarkable. IMPRESSION: No active disease. Electronically Signed   By: Helyn Numbers MD   On: 11/20/2020 00:55       LOS: 0 days   Osvaldo Shipper  [facility] Pager on [email or web address]  11/20/2020, 12:51 PM

## 2020-11-20 NOTE — H&P (Signed)
History and Physical  Anthony Ponce YOV:[id number] DOB: May 30, 1995 DOA: 11/20/2020  Referring physician: Glynn Octave, MD PCP: Patient, No Pcp Per (Inactive)  Patient coming from: Home  Chief Complaint: Drug overdose  HPI: Anthony Ponce is a 25 y.o. male with medical history significant for cirrhosis, Crohn's disease, tobacco use, drug abuse who presents emergency department via EMS due to drug overdose.  Patient states that he was snorting heroine and then tried to rest.  Rest of the history was obtained from ED physician and ED medical record.  Per report, patient was apparently unresponsive when EMS arrives to assess patient.  7 mg of Narcan was given and patient woke up.  He was complaining of being thirsty and asking for water, patient denies any suicidal ideas or thoughts.  He denies IV drug use (he states that he does not like needles).  ED Course:  In the emergency department, patient was reported to be in A. fib RVR on arrival and blood glucose was 362 (patient has no history of diabetes).  Patient's heart rate eventually normalized and other vital signs remained within normal range.  Work-up in the ED showed leukocytosis, hyperglycemia, transaminases, lactic acid was 2.2, D-dimer 2.03, salicylate level, acetaminophen level and ethanol level were negative, beta hydroxybutyric acid was 0.10.  Influenza A, B, SARS coronavirus 2 was negative. Chest x-ray showed no active disease.  Review of Systems: Constitutional: Positive for fatigue.  Negative for chills and fever.  HENT: Negative for ear pain and sore throat.   Eyes: Negative for pain and visual disturbance.  Respiratory: Negative for cough, chest tightness and shortness of breath.   Cardiovascular: Negative for chest pain and palpitations.  Gastrointestinal: Negative for abdominal pain and vomiting.  Endocrine: Positive for thirst.  Negative for polyphagia and polyuria.  Genitourinary: Negative for decreased urine volume,  dysuria, enuresis Musculoskeletal: Negative for arthralgias and back pain.  Skin: Negative for color change and rash.  Allergic/Immunologic: Negative for immunocompromised state.  Neurological: Positive for weakness.  Negative for tremors, syncope, speech difficulty Hematological: Does not bruise/bleed easily.  All other systems reviewed and are negative   Past Medical History:  Diagnosis Date   Cirrhosis (HCC)    Crohn disease (HCC)    Liver damage    Pt goes to [facility] hospital   Past Surgical History:  Procedure Laterality Date   COLONOSCOPY W/ BIOPSIES     ESOPHAGOSCOPY      Social History:  reports that he has been smoking cigarettes. He has been smoking an average of .5 packs per day. He has never used smokeless tobacco. He reports current alcohol use. He reports that he does not use drugs.   No Known Allergies  No family history on file.   Prior to Admission medications   Medication Sig Start Date End Date Taking? Authorizing Provider  nicotine (NICODERM CQ - DOSED IN MG/24 HOURS) 14 mg/24hr patch Place 1 patch (14 mg total) onto the skin daily. Patient not taking: Reported on 07/16/2020 04/21/20   Vassie Loll, MD  ondansetron (ZOFRAN ODT) 8 MG disintegrating tablet Take 1 tablet (8 mg total) by mouth every 8 (eight) hours as needed for nausea or vomiting. Patient not taking: Reported on 07/16/2020 04/20/20   Vassie Loll, MD  pantoprazole (PROTONIX) 40 MG tablet Take 1 tablet (40 mg total) by mouth daily. Patient not taking: Reported on 07/16/2020 04/20/20   Vassie Loll, MD    Physical Exam: BP 121/90   Pulse 69   Temp  98.1 F (36.7 C) (Oral)   Resp 11   Ht 5\' 10"  (1.778 m)   Wt 75 kg   SpO2 94%   BMI 23.72 kg/m   General: 25 y.o. year-old male well developed well nourished in no acute distress.  Alert and oriented x3. HEENT: Dry mucous membrane.  NCAT, EOMI Neck: Supple, trachea medial Cardiovascular: Regular rate and rhythm with no rubs or gallops.  No  thyromegaly or JVD noted.  2/4 pulses in all 4 extremities. Respiratory: Clear to auscultation with no wheezes or rales. Good inspiratory effort. Abdomen: Soft, nontender nondistended with normal bowel sounds x4 quadrants. Muskuloskeletal: No cyanosis, clubbing or edema noted bilaterally Neuro: CN II-XII intact, strength 5/5 x 4, sensation, reflexes intact Skin: No ulcerative lesions noted or rashes Psychiatry: Judgement and insight appear normal. Mood is appropriate for condition and setting          Labs on Admission:  Basic Metabolic Panel: Recent Labs  Lab 11/20/20 0039 11/20/20 0329  NA 136 138  K 4.1 4.0  CL 105 108  CO2 20* 27  GLUCOSE 275* 55*  BUN 16 14  CREATININE 1.19 0.90  CALCIUM 8.2* 7.9*   Liver Function Tests: Recent Labs  Lab 11/20/20 0039  AST 66*  ALT 105*  ALKPHOS 102  BILITOT 0.7  PROT 7.4  ALBUMIN 4.1   No results for input(s): LIPASE, AMYLASE in the last 168 hours. No results for input(s): AMMONIA in the last 168 hours. CBC: Recent Labs  Lab 11/20/20 0039  WBC 19.2*  NEUTROABS 17.4*  HGB 15.8  HCT 45.1  MCV 90.2  PLT 205   Cardiac Enzymes: No results for input(s): CKTOTAL, CKMB, CKMBINDEX, TROPONINI in the last 168 hours.  BNP (last 3 results) Recent Labs    04/20/20 0619  BNP 33.0    ProBNP (last 3 results) No results for input(s): PROBNP in the last 8760 hours.  CBG: Recent Labs  Lab 11/20/20 0007 11/20/20 0411 11/20/20 0509  GLUCAP 362* 59* 95    Radiological Exams on Admission: DG Chest Portable 1 View  Result Date: 11/20/2020 CLINICAL DATA:  Drug overdose, Crohn's disease, unresponsive EXAM: PORTABLE CHEST 1 VIEW COMPARISON:  04/19/2020 FINDINGS: The heart size and mediastinal contours are within normal limits. Both lungs are clear. The visualized skeletal structures are unremarkable. IMPRESSION: No active disease. Electronically Signed   By: 04/21/2020 MD   On: 11/20/2020 00:55    EKG: I independently viewed  the EKG done and my findings are as followed:  Initial EKG shows A. fib with RVR; subsequent EKG shows normal sinus rhythm at a rate of 95 bpm  Assessment/Plan Present on Admission:  Acute respiratory failure with hypoxia (HCC)  Crohn's disease with complication (HCC)  Other cirrhosis of liver (HCC)  Tobacco abuse  Principal Problem:   Heroin overdose (HCC) Active Problems:   Acute respiratory failure with hypoxia (HCC)   Crohn's disease with complication (HCC)   Other cirrhosis of liver (HCC)   Tobacco abuse   Hyperglycemia   Lactic acidosis   Transaminitis   Leukocytosis   Elevated d-dimer  Transitory acute respiratory failure with hypoxia in the setting of heroin overdose Patient was initially hypoxic on arrival to the ED, but he has since improved and O2 sat at bedside was 94% on room air 7 mg of Narcan was given PTA He was counseled on heroin use cessation  Hyperglycemia with no known history of T2DM CBG on arrival was 362, hemoglobin A1c pending  Continue to monitor CBG with morning labs  Lactic acidosis possibly secondary to multifactorial Lactic acid 2.2, continue to monitor lactic acid  Dehydration Continue IV hydration  Transaminitis possibly due to history of cirrhosis AST 66, ALT 105; continue to monitor liver enzymes  Leukocytosis possibly reactive WBC 19.2; no obvious sign of any acute infectious process at this time Continue to monitor WBC with morning labs  Elevated D-dimer D-dimer was 2.03, CT angiography of chest to rule out PE pending  Crohn's disease Patient follows with [facility] hospital   DVT prophylaxis: Lovenox  Code Status: Full code  Family Communication: None at bedside  Disposition Plan:  Patient is from:                        home Anticipated DC to:                   SNF or family members home Anticipated DC date:               2-3 days Anticipated DC barriers:          Patient requires inpatient management of heroin overdose     Consults called: None  Admission status: Observation    Frankey Shown MD [facility]  11/20/2020, 7:47 AM

## 2020-11-20 NOTE — ED Notes (Signed)
Date and time results received: 11/20/20 0146  Test: Lactic Acid Critical Value: 2.2  Name of Provider Notified: Dr. Manus Gunning  Orders Received? Or Actions Taken?: Acknowledged

## 2020-11-21 DIAGNOSIS — J690 Pneumonitis due to inhalation of food and vomit: Secondary | ICD-10-CM

## 2020-11-21 LAB — CBC
HCT: 45.4 % (ref 39.0–52.0)
Hemoglobin: 15.4 g/dL (ref 13.0–17.0)
MCH: 31.5 pg (ref 26.0–34.0)
MCHC: 33.9 g/dL (ref 30.0–36.0)
MCV: 92.8 fL (ref 80.0–100.0)
Platelets: 172 10*3/uL (ref 150–400)
RBC: 4.89 MIL/uL (ref 4.22–5.81)
RDW: 12.8 % (ref 11.5–15.5)
WBC: 5.4 10*3/uL (ref 4.0–10.5)
nRBC: 0 % (ref 0.0–0.2)

## 2020-11-21 LAB — COMPREHENSIVE METABOLIC PANEL
ALT: 73 U/L — ABNORMAL HIGH (ref 0–44)
AST: 34 U/L (ref 15–41)
Albumin: 3.8 g/dL (ref 3.5–5.0)
Alkaline Phosphatase: 88 U/L (ref 38–126)
Anion gap: 8 (ref 5–15)
BUN: 9 mg/dL (ref 6–20)
CO2: 27 mmol/L (ref 22–32)
Calcium: 9.1 mg/dL (ref 8.9–10.3)
Chloride: 104 mmol/L (ref 98–111)
Creatinine, Ser: 0.83 mg/dL (ref 0.61–1.24)
GFR, Estimated: >60 mL/min (ref >60)
Glucose, Bld: 84 mg/dL (ref 70–99)
Potassium: 4 mmol/L (ref 3.5–5.1)
Sodium: 139 mmol/L (ref 135–145)
Total Bilirubin: 1 mg/dL (ref 0.3–1.2)
Total Protein: 6.7 g/dL (ref 6.5–8.1)

## 2020-11-21 LAB — MAGNESIUM: Magnesium: 2.1 mg/dL (ref 1.7–2.4)

## 2020-11-21 LAB — HEMOGLOBIN A1C
Hgb A1c MFr Bld: 5.1 % (ref 4.8–5.6)
Mean Plasma Glucose: 99.67 mg/dL

## 2020-11-21 LAB — PROTIME-INR
INR: 1 (ref 0.8–1.2)
Prothrombin Time: 13 seconds (ref 11.4–15.2)

## 2020-11-21 LAB — HEPATITIS PANEL, ACUTE
HCV Ab: NONREACTIVE
Hep A IgM: NONREACTIVE
Hep B C IgM: NONREACTIVE
Hepatitis B Surface Ag: NONREACTIVE

## 2020-11-21 LAB — APTT: aPTT: 26 seconds (ref 24–36)

## 2020-11-21 MED ORDER — GUAIFENESIN ER 600 MG PO TB12: 600.0000 mg | ORAL_TABLET | Freq: Two times a day (BID) | ORAL | 0 refills | Status: AC

## 2020-11-21 MED ORDER — AMOXICILLIN-POT CLAVULANATE 875-125 MG PO TABS: 1.0000 | ORAL_TABLET | Freq: Two times a day (BID) | ORAL | 0 refills | Status: AC

## 2020-11-21 MED ORDER — PANTOPRAZOLE SODIUM 40 MG PO TBEC: 40.0000 mg | DELAYED_RELEASE_TABLET | Freq: Every day | ORAL | 1 refills | Status: AC

## 2020-11-21 NOTE — Progress Notes (Signed)
Nsg Discharge Note  Admit Date:  11/20/2020 Discharge date: 11/21/2020   Junie Panning to be D/C'd Home per MD order.  AVS completed.   Patient able to verbalize understanding.  Discharge Medication: Allergies as of 11/21/2020   No Known Allergies      Medication List     STOP taking these medications    nicotine 14 mg/24hr patch Commonly known as: NICODERM CQ - dosed in mg/24 hours   ondansetron 8 MG disintegrating tablet Commonly known as: Zofran ODT       TAKE these medications    amoxicillin-clavulanate 875-125 MG tablet Commonly known as: AUGMENTIN Take 1 tablet by mouth every 12 (twelve) hours for 5 days.   guaiFENesin 600 MG 12 hr tablet Commonly known as: Mucinex Take 1 tablet (600 mg total) by mouth 2 (two) times daily.   pantoprazole 40 MG tablet Commonly known as: PROTONIX Take 1 tablet (40 mg total) by mouth daily.        Discharge Assessment: Vitals:   11/20/20 2015 11/21/20 0333  BP: 114/65 101/69  Pulse: 63 (!) 57  Resp: (!) 21 20  Temp: 98.2 F (36.8 C) 97.8 F (36.6 C)  SpO2: 100% 99%   Skin clean, dry and intact without evidence of skin break down, no evidence of skin tears noted. IV catheter discontinued intact. Site without signs and symptoms of complications - no redness or edema noted at insertion site, patient denies c/o pain - only slight tenderness at site.  Dressing with slight pressure applied.  D/c Instructions-Education: Discharge instructions given to patient with verbalized understanding. D/c education completed with patient/family including follow up instructions, medication list, d/c activities limitations if indicated, with other d/c instructions as indicated by MD - patient able to verbalize understanding, all questions fully answered. Patient instructed to return to ED, call 911, or call MD for any changes in condition.  Patient escorted via WC, and D/C home via private auto.  Consuelo Thayne Salena Saner, RN 11/21/2020 11:27 AM

## 2020-11-21 NOTE — Discharge Summary (Signed)
[facility]  Physician Discharge Summary   Patient ID: Anthony Ponce MRN: [id number] DOB/AGE: [birth year] 24 y.o.  Admit date: 11/20/2020 Discharge date: 11/21/2020    PCP: Patient, No Pcp Per (Inactive)  DISCHARGE DIAGNOSES:  Heroin overdose Aspiration pneumonia Transaminitis History of Crohn's disease  RECOMMENDATIONS FOR OUTPATIENT FOLLOW UP: Patient instructed to follow-up with his outpatient provider in 1 week   Home Health: None Equipment/Devices: None  CODE STATUS: Full code  DISCHARGE CONDITION: good  Diet recommendation: As before  INITIAL HISTORY: 25 y.o. male with medical history significant for cirrhosis, Crohn's disease, tobacco use, drug abuse who presented to the emergency department via EMS due to drug overdose.  Patient states that he was snorting heroine and then tried to rest.  Apparently patient was unresponsive when EMS arrived.  He was given Narcan and then he woke up.  Patient also was noted to be in atrial fibrillation with RVR in the emergency department.  He was also noted to be hypoxic in the emergency department.  He was hospitalized for further management.   HOSPITAL COURSE:   Heroin overdose Patient was obtunded when evaluated by EMS.  He was given Narcan with improvement in his mentation.  Patient has been counseled regarding his drug use.  He is back to his baseline.  Aspiration pneumonia/transient hypoxia Chest x-ray did not show any evidence for infection.  He was noted to have elevated D-dimer.  CT angiogram was done which did not show any PE however does raise concern for airspace opacities.  Its possible patient may have aspirated.  His COVID-19 test was negative.  He does have elevated WBC.  Patient was placed on Augmentin.  He does have some blood in his sputum.  Reassuringly no PE was noted on CT scan.  Patient was reassured.  He was told that this was likely due to the pneumonia itself.  He was told to seek attention if he  notices increased amount of blood in the sputum. Hypoxia has resolved.  He is saturating normal on room air.   Hyperglycemia Presented with significantly elevated glucose levels but it was a nonfasting level.  Subsequently noted to be hypoglycemic.  HbA1c is 5.1.  No further inpatient work-up.  Lactic acidosis Probably multifactorial.  Subsequently normal.   Transaminitis Mildly elevated AST and ALT noted.  Hepatitis panel unremarkable.  Liver enzymes have improved.  Right upper quadrant ultrasound did not show any acute findings.    History of Crohn's disease Followed at [facility] in [place].  Does not appear to be an active issue currently.  Patient is overall stable.  Feels better.  Okay for discharge today after he has ambulated.   PERTINENT LABS:  The results of significant diagnostics from this hospitalization (including imaging, microbiology, ancillary and laboratory) are listed below for reference.    Microbiology: Recent Results (from the past 240 hour(s))  Blood culture (routine x 2)     Status: None (Preliminary result)   Collection Time: 11/20/20  1:15 AM   Specimen: BLOOD LEFT HAND  Result Value Ref Range Status   Specimen Description BLOOD LEFT HAND  Final   Special Requests   Final    BOTTLES DRAWN AEROBIC AND ANAEROBIC Blood Culture adequate volume   Culture   Final    NO GROWTH < 12 HOURS Performed at [facility], [address], [place], [place] [address]    Report Status PENDING  Incomplete  Blood culture (routine x 2)     Status: None (  Preliminary result)   Collection Time: 11/20/20  3:29 AM   Specimen: BLOOD LEFT HAND  Result Value Ref Range Status   Specimen Description BLOOD LEFT HAND  Final   Special Requests   Final    BOTTLES DRAWN AEROBIC AND ANAEROBIC Blood Culture adequate volume   Culture   Final    NO GROWTH < 12 HOURS Performed at [facility], [address], [place], [place] [address]    Report Status PENDING  Incomplete   Resp Panel by RT-PCR (Flu A&B, Covid) Nasopharyngeal Swab     Status: None   Collection Time: 11/20/20  4:55 AM   Specimen: Nasopharyngeal Swab; Nasopharyngeal(NP) swabs in vial transport medium  Result Value Ref Range Status   SARS Coronavirus 2 by RT PCR NEGATIVE NEGATIVE Final    Comment: (NOTE) SARS-CoV-2 target nucleic acids are NOT DETECTED.  The SARS-CoV-2 RNA is generally detectable in upper respiratory specimens during the acute phase of infection. The lowest concentration of SARS-CoV-2 viral copies this assay can detect is 138 copies/mL. A negative result does not preclude SARS-Cov-2 infection and should not be used as the sole basis for treatment or other patient management decisions. A negative result may occur with  improper specimen collection/handling, submission of specimen other than nasopharyngeal swab, presence of viral mutation(s) within the areas targeted by this assay, and inadequate number of viral copies(<138 copies/mL). A negative result must be combined with clinical observations, patient history, and epidemiological information. The expected result is Negative.  Fact Sheet for Patients:  BloggerCourse.com  Fact Sheet for Healthcare Providers:  SeriousBroker.it  This test is no t yet approved or cleared by the [place] FDA and  has been authorized for detection and/or diagnosis of SARS-CoV-2 by FDA under an Emergency Use Authorization (EUA). This EUA will remain  in effect (meaning this test can be used) for the duration of the COVID-19 declaration under Section 564(b)(1) of the Act, 21 U.S.C.section 360bbb-3(b)(1), unless the authorization is terminated  or revoked sooner.       Influenza A by PCR NEGATIVE NEGATIVE Final   Influenza B by PCR NEGATIVE NEGATIVE Final    Comment: (NOTE) The Xpert Xpress SARS-CoV-2/FLU/RSV plus assay is intended as an aid in the diagnosis of influenza from  Nasopharyngeal swab specimens and should not be used as a sole basis for treatment. Nasal washings and aspirates are unacceptable for Xpert Xpress SARS-CoV-2/FLU/RSV testing.  Fact Sheet for Patients: BloggerCourse.com  Fact Sheet for Healthcare Providers: SeriousBroker.it  This test is not yet approved or cleared by the [place] FDA and has been authorized for detection and/or diagnosis of SARS-CoV-2 by FDA under an Emergency Use Authorization (EUA). This EUA will remain in effect (meaning this test can be used) for the duration of the COVID-19 declaration under Section 564(b)(1) of the Act, 21 U.S.C. section 360bbb-3(b)(1), unless the authorization is terminated or revoked.  Performed at [facility], [address], [place], [place] [address]      Labs:  COVID-19 Labs  Recent Labs    11/20/20 0039  DDIMER 2.03*    Lab Results  Component Value Date   SARSCOV2NAA NEGATIVE 11/20/2020   SARSCOV2NAA NEGATIVE 04/18/2020      Basic Metabolic Panel: Recent Labs  Lab 11/20/20 0039 11/20/20 0329 11/21/20 0645  NA 136 138 139  K 4.1 4.0 4.0  CL 105 108 104  CO2 20* 27 27  GLUCOSE 275* 55* 84  BUN 16 14 9   CREATININE 1.19 0.90 0.83  CALCIUM 8.2* 7.9* 9.1  MG  --   --  2.1   Liver Function Tests: Recent Labs  Lab 11/20/20 0039 11/21/20 0645  AST 66* 34  ALT 105* 73*  ALKPHOS 102 88  BILITOT 0.7 1.0  PROT 7.4 6.7  ALBUMIN 4.1 3.8   No results for input(s): LIPASE, AMYLASE in the last 168 hours. No results for input(s): AMMONIA in the last 168 hours. CBC: Recent Labs  Lab 11/20/20 0039 11/21/20 0645  WBC 19.2* 5.4  NEUTROABS 17.4*  --   HGB 15.8 15.4  HCT 45.1 45.4  MCV 90.2 92.8  PLT 205 172      CBG: Recent Labs  Lab 11/20/20 0007 11/20/20 0411 11/20/20 0509  GLUCAP 362* 59* 95     IMAGING STUDIES CT Angio Chest Pulmonary Embolism (PE) W or WO Contrast  Result Date:  11/20/2020 CLINICAL DATA:  PE suspected, low/intermediate prob, positive D-dimer EXAM: CT ANGIOGRAPHY CHEST WITH CONTRAST TECHNIQUE: Multidetector CT imaging of the chest was performed using the standard protocol during bolus administration of intravenous contrast. Multiplanar CT image reconstructions and MIPs were obtained to evaluate the vascular anatomy. CONTRAST:  OMNIPAQUE IOHEXOL 350 MG/ML SOLN COMPARISON:  04/18/2020 FINDINGS: Cardiovascular: No filling defects in the pulmonary arteries to suggest pulmonary emboli. Heart is normal size. Aorta normal caliber. Mediastinum/Nodes: No mediastinal, hilar, or axillary adenopathy. Trachea and esophagus are unremarkable. Thyroid unremarkable. Lungs/Pleura: Airspace disease in the lower lobes, left greater than right as well as posterior left upper lobe. Findings could reflect early pneumonia. No effusions. Upper Abdomen: Imaging into the upper abdomen demonstrates no acute findings. Musculoskeletal: Chest wall soft tissues are unremarkable. No acute bony abnormality. Review of the MIP images confirms the above findings. IMPRESSION: No evidence of pulmonary embolus. Airspace disease in the left lower lobe, right base and posterior left upper lobe concerning for early pneumonia. Electronically Signed   By: Charlett Nose M.D.   On: 11/20/2020 09:12   DG Chest Portable 1 View  Result Date: 11/20/2020 CLINICAL DATA:  Drug overdose, Crohn's disease, unresponsive EXAM: PORTABLE CHEST 1 VIEW COMPARISON:  04/19/2020 FINDINGS: The heart size and mediastinal contours are within normal limits. Both lungs are clear. The visualized skeletal structures are unremarkable. IMPRESSION: No active disease. Electronically Signed   By: Helyn Numbers MD   On: 11/20/2020 00:55   US Abdomen Limited RUQ (LIVER/GB)  Result Date: 11/20/2020 CLINICAL DATA:  Elevated liver function studies. EXAM: ULTRASOUND ABDOMEN LIMITED RIGHT UPPER QUADRANT COMPARISON:  CT abdomen/pelvis 04/20/2016  FINDINGS: Gallbladder: No gallstones or wall thickening visualized. No sonographic Murphy sign noted by sonographer. Common bile duct: Diameter: 4.1 mm Liver: Normal echogenicity without focal lesion or biliary dilatation. Portal vein is patent on color Doppler imaging with normal direction of blood flow towards the liver. Other: No right upper quadrant ascites. IMPRESSION: Unremarkable right upper quadrant ultrasound examination. Electronically Signed   By: Rudie Meyer M.D.   On: 11/20/2020 14:16    DISCHARGE EXAMINATION: Vitals:   11/20/20 1530 11/20/20 1637 11/20/20 2015 11/21/20 0333  BP: 119/80 124/64 114/65 101/69  Pulse: 85 69 63 (!) 57  Resp: 12 18 (!) 21 20  Temp:  98.8 F (37.1 C) 98.2 F (36.8 C) 97.8 F (36.6 C)  TempSrc:  Oral Oral Oral  SpO2: 98% 98% 100% 99%  Weight:      Height:       General appearance: Awake alert.  In no distress Resp: Normal effort at rest.  Few crackles  bilateral bases.  No wheezing or rhonchi.   Cardio: S1-S2 is normal regular.  No S3-S4.  No rubs murmurs or bruit GI: Abdomen is soft.  Nontender nondistended.  Bowel sounds are present normal.  No masses organomegaly    DISPOSITION: Home  Discharge Instructions     Call MD for:  difficulty breathing, headache or visual disturbances   Complete by: As directed    Call MD for:  extreme fatigue   Complete by: As directed    Call MD for:  persistant dizziness or light-headedness   Complete by: As directed    Call MD for:  persistant nausea and vomiting   Complete by: As directed    Call MD for:  severe uncontrolled pain   Complete by: As directed    Call MD for:  temperature >100.4   Complete by: As directed    Diet general   Complete by: As directed    Discharge instructions   Complete by: As directed    Please refrain from recreational drug use.  Take your medications as prescribed.  Seek attention if your shortness of breath gets worse or if you notice more blood in your sputum.  You  were cared for by a hospitalist during your hospital stay. If you have any questions about your discharge medications or the care you received while you were in the hospital after you are discharged, you can call the unit and asked to speak with the hospitalist on call if the hospitalist that took care of you is not available. Once you are discharged, your primary care physician will handle any further medical issues. Please note that NO REFILLS for any discharge medications will be authorized once you are discharged, as it is imperative that you return to your primary care physician (or establish a relationship with a primary care physician if you do not have one) for your aftercare needs so that they can reassess your need for medications and monitor your lab values. If you do not have a primary care physician, you can call [phone] for a physician referral.   Increase activity slowly   Complete by: As directed           Allergies as of 11/21/2020   No Known Allergies      Medication List     STOP taking these medications    nicotine 14 mg/24hr patch Commonly known as: NICODERM CQ - dosed in mg/24 hours   ondansetron 8 MG disintegrating tablet Commonly known as: Zofran ODT       TAKE these medications    amoxicillin-clavulanate 875-125 MG tablet Commonly known as: AUGMENTIN Take 1 tablet by mouth every 12 (twelve) hours for 5 days.   guaiFENesin 600 MG 12 hr tablet Commonly known as: Mucinex Take 1 tablet (600 mg total) by mouth 2 (two) times daily.   pantoprazole 40 MG tablet Commonly known as: PROTONIX Take 1 tablet (40 mg total) by mouth daily.          Follow-up Information     [facility] Follow up.   Why: To establish primary care physician. Contact information: [phone]                TOTAL DISCHARGE TIME: 35 minutes  Eliott Amparan [employer] on [email or web address]  11/22/2020, 11:21 AM

## 2020-11-26 LAB — CULTURE, BLOOD (ROUTINE X 2)
Culture: NO GROWTH
Culture: NO GROWTH
Special Requests: ADEQUATE
Special Requests: ADEQUATE

## 2023-07-07 IMAGING — CT CT ANGIO CHEST
2 of 6 series · 18 of 46 positions shown · IV contrast (Omnipaque or Isovue)
Comparison: 04/18/2020

CLINICAL DATA: PE suspected, low/intermediate prob, positive
D-dimer

EXAM:
CT ANGIOGRAPHY CHEST WITH CONTRAST
TECHNIQUE: Multidetector CT imaging of the chest was performed using the
standard protocol during bolus administration of intravenous
contrast. Multiplanar CT image reconstructions and MIPs were
obtained to evaluate the vascular anatomy.
CONTRAST:  100mL OMNIPAQUE IOHEXOL 350 MG/ML SOLN

[Series 5: pe axial thins · axial · 0.68mm/px · z∈[+1322,+1568]mm · 15 of 337 slices shown]
[im 15/337  lung]
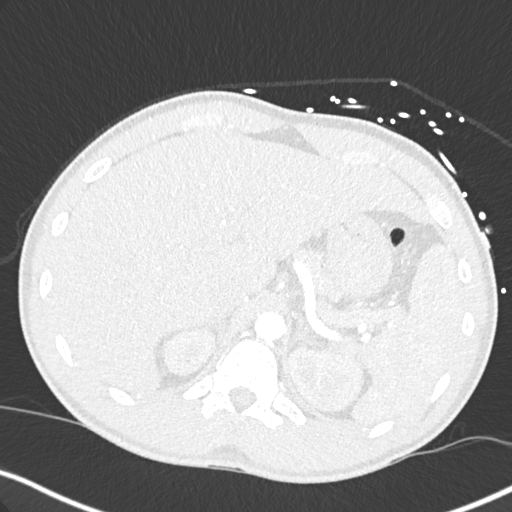
[im 43/337  soft-tissue]
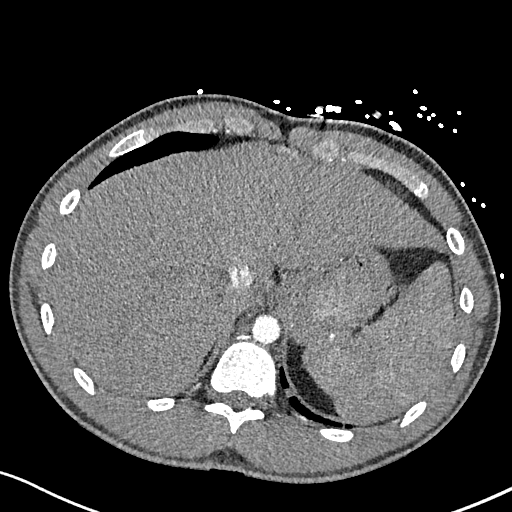
[im 57/337  lung]
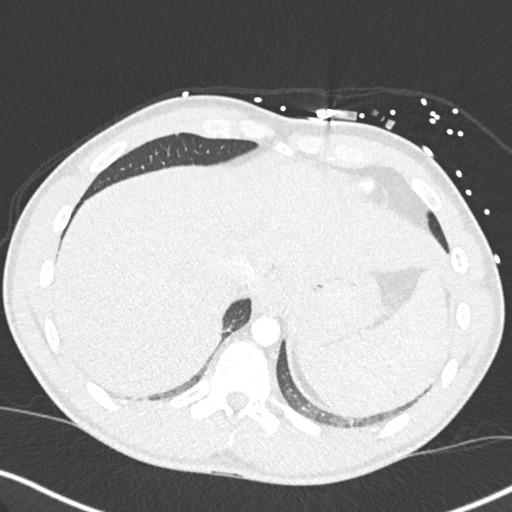
[im 85/337  soft-tissue]
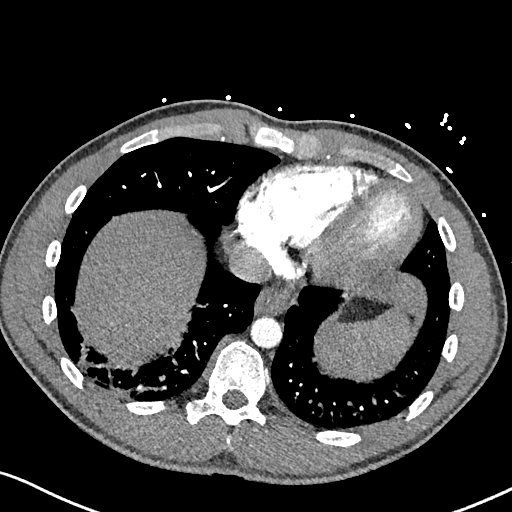
[im 99/337  lung]
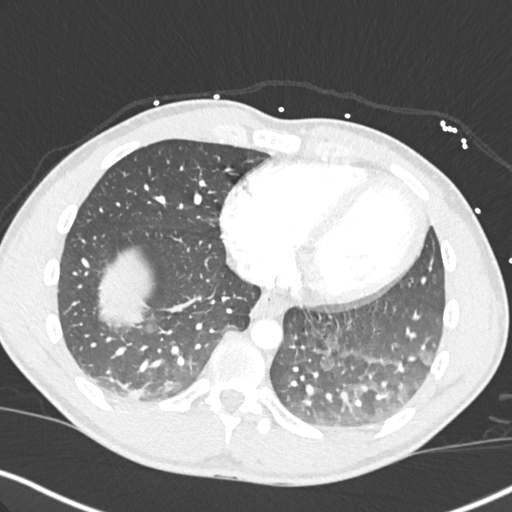
[im 127/337  soft-tissue]
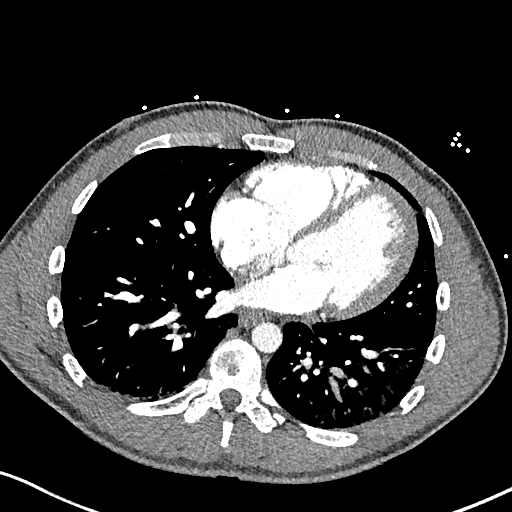
[im 141/337  lung]
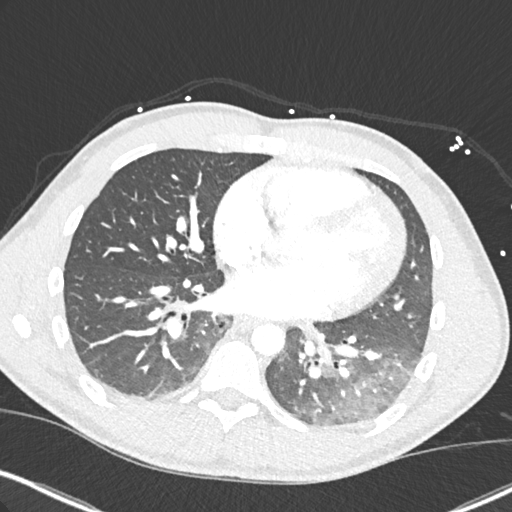
[im 169/337  soft-tissue]
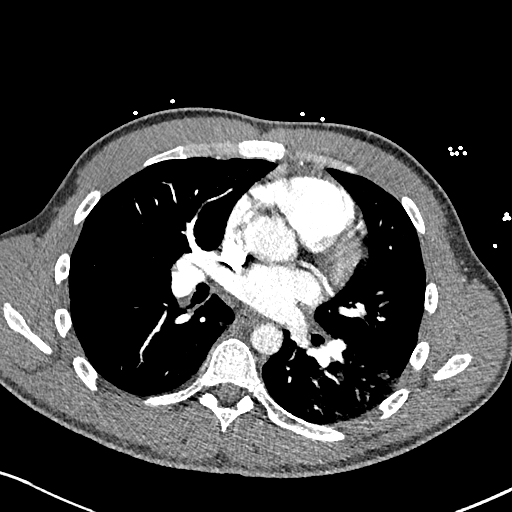
[im 197/337  lung]
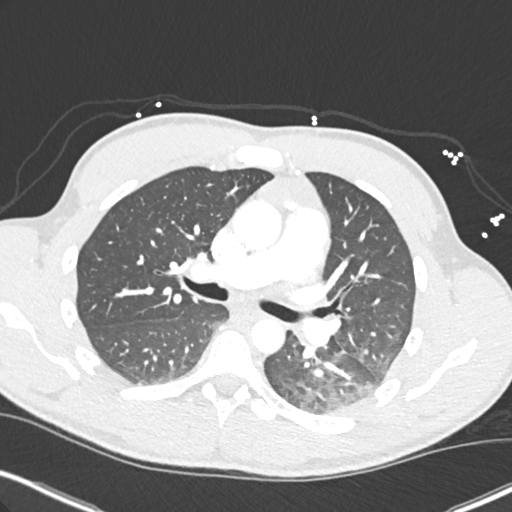
[im 211/337  soft-tissue]
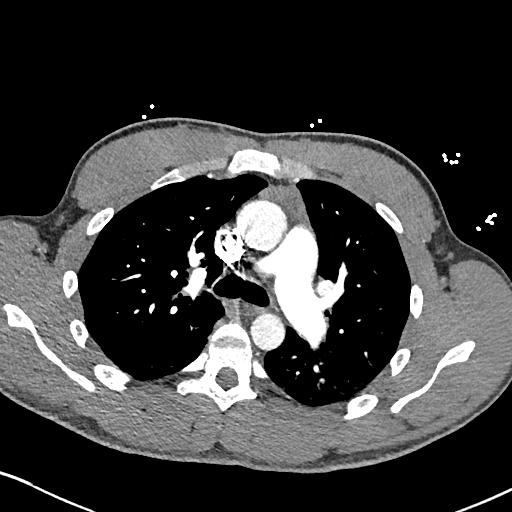
[im 239/337  lung]
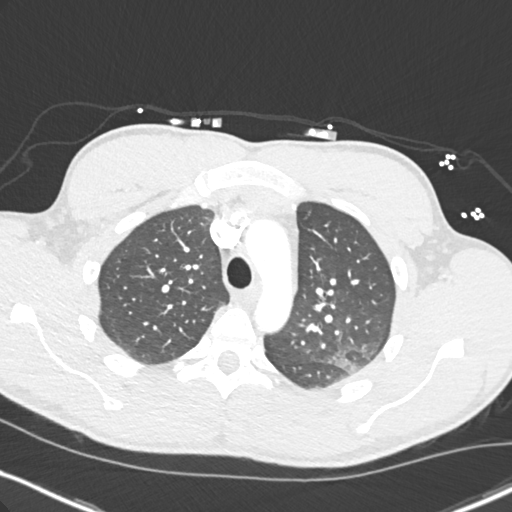
[im 253/337  soft-tissue]
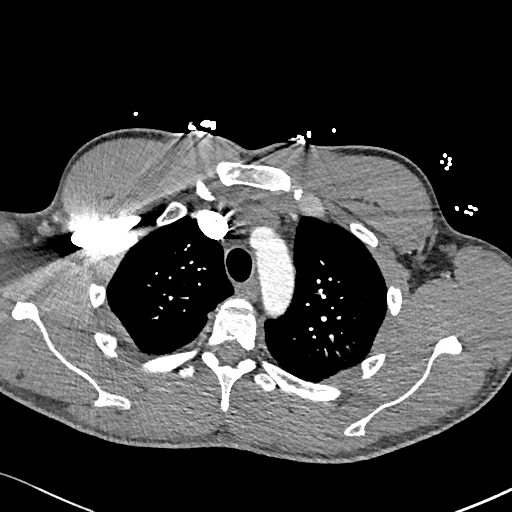
[im 281/337  lung]
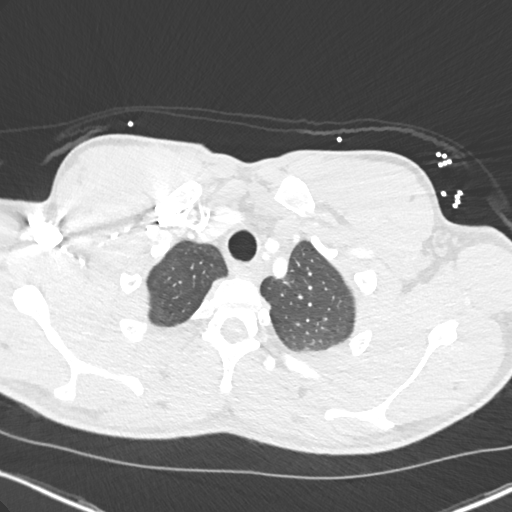
[im 295/337  soft-tissue]
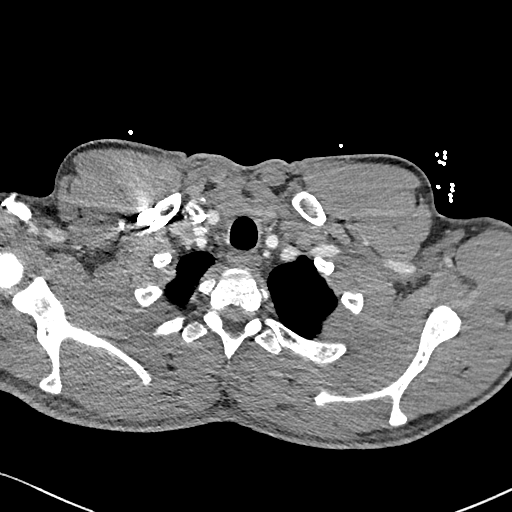
[im 323/337  lung]
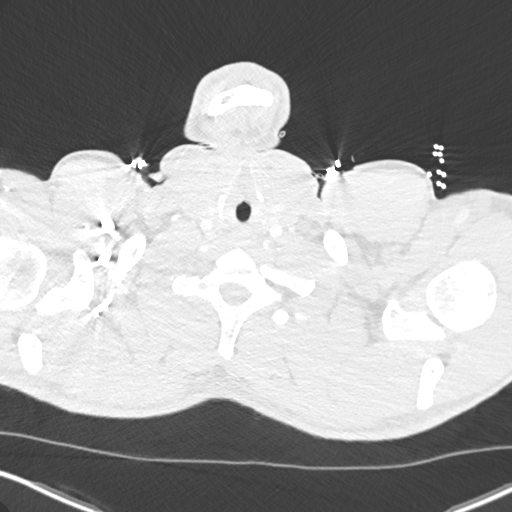

[Series 7: cor soft · coronal · 0.55mm/px · 3 of 150 slices shown]
[im 38/150  soft-tissue]
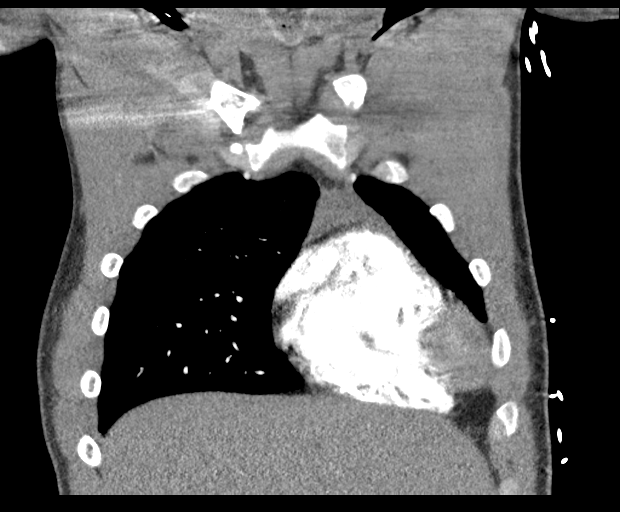
[im 75/150  soft-tissue]
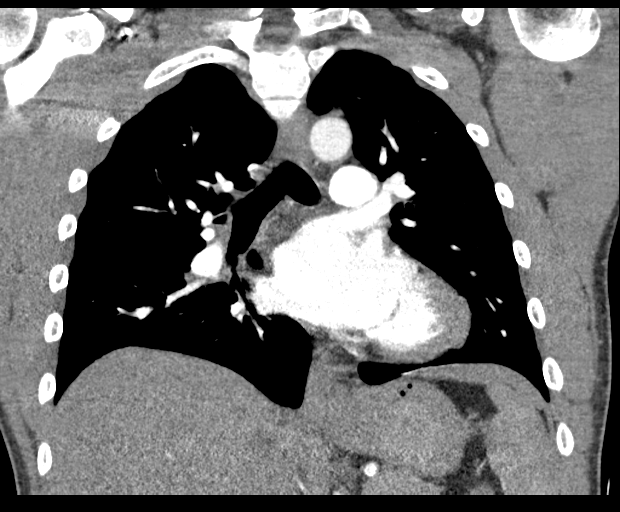
[im 112/150  soft-tissue]
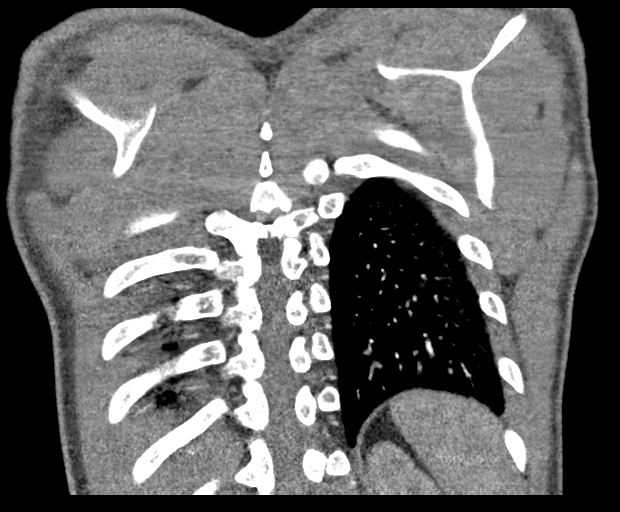

[18 of 46 positions shown; findings below may reference images not displayed]

FINDINGS: Cardiovascular: No filling defects in the pulmonary arteries to
suggest pulmonary emboli. Heart is normal size. Aorta normal
caliber.

Mediastinum/Nodes: No mediastinal, hilar, or axillary adenopathy.
Trachea and esophagus are unremarkable. Thyroid unremarkable.

Lungs/Pleura: Airspace disease in the lower lobes, left greater than
right as well as posterior left upper lobe. Findings could reflect
early pneumonia. No effusions.

Upper Abdomen: Imaging into the upper abdomen demonstrates no acute
findings.

Musculoskeletal: Chest wall soft tissues are unremarkable. No acute
bony abnormality.

Review of the MIP images confirms the above findings.
IMPRESSION: No evidence of pulmonary embolus.

Airspace disease in the left lower lobe, right base and posterior
left upper lobe concerning for early pneumonia.
